# Patient Record
Sex: Male | Born: 1975
Health system: Southern US, Community
[De-identification: ages and names within clinical notes are randomized; demographics above are authoritative.]

## PROBLEM LIST (undated history)

## (undated) DIAGNOSIS — M1990 Unspecified osteoarthritis, unspecified site: Secondary | ICD-10-CM

## (undated) DIAGNOSIS — J45909 Unspecified asthma, uncomplicated: Secondary | ICD-10-CM

## (undated) DIAGNOSIS — I10 Essential (primary) hypertension: Secondary | ICD-10-CM

---

## 2006-03-21 ENCOUNTER — Other Ambulatory Visit: Payer: Self-pay

## 2006-03-21 ENCOUNTER — Emergency Department: Payer: Self-pay | Admitting: Unknown Physician Specialty

## 2014-07-07 ENCOUNTER — Other Ambulatory Visit: Payer: Self-pay

## 2014-07-07 ENCOUNTER — Emergency Department
Admission: EM | Admit: 2014-07-07 | Discharge: 2014-07-07 | Discharge status: Against Medical Advice | Payer: Federal, State, Local not specified - PPO | Attending: Emergency Medicine | Admitting: Emergency Medicine

## 2014-07-07 ENCOUNTER — Encounter: Payer: Self-pay | Admitting: Emergency Medicine

## 2014-07-07 DIAGNOSIS — R531 Weakness: Secondary | ICD-10-CM | POA: Diagnosis present

## 2014-07-07 DIAGNOSIS — R0602 Shortness of breath: Secondary | ICD-10-CM | POA: Diagnosis not present

## 2014-07-07 DIAGNOSIS — R42 Dizziness and giddiness: Secondary | ICD-10-CM | POA: Insufficient documentation

## 2014-07-07 DIAGNOSIS — R55 Syncope and collapse: Secondary | ICD-10-CM | POA: Diagnosis not present

## 2014-07-07 DIAGNOSIS — Z720 Tobacco use: Secondary | ICD-10-CM | POA: Insufficient documentation

## 2014-07-07 LAB — CBC
HCT: 44.5 % (ref 40.0–52.0)
Hemoglobin: 15.5 g/dL (ref 13.0–18.0)
MCH: 30.8 pg (ref 26.0–34.0)
MCHC: 34.7 g/dL (ref 32.0–36.0)
MCV: 88.7 fL (ref 80.0–100.0)
Platelets: 223 10*3/uL (ref 150–440)
RBC: 5.02 MIL/uL (ref 4.40–5.90)
RDW: 12.7 % (ref 11.5–14.5)
WBC: 6.8 10*3/uL (ref 3.8–10.6)

## 2014-07-07 LAB — URINALYSIS COMPLETE WITH MICROSCOPIC (ARMC ONLY)
Bilirubin Urine: NEGATIVE
GLUCOSE, UA: NEGATIVE mg/dL
HGB URINE DIPSTICK: NEGATIVE
Ketones, ur: NEGATIVE mg/dL
LEUKOCYTES UA: NEGATIVE
Nitrite: NEGATIVE
PH: 7 (ref 5.0–8.0)
Protein, ur: NEGATIVE mg/dL
SPECIFIC GRAVITY, URINE: 1.002 — AB (ref 1.005–1.030)
Squamous Epithelial / LPF: NONE SEEN
WBC, UA: NONE SEEN WBC/hpf (ref 0–5)

## 2014-07-07 LAB — BASIC METABOLIC PANEL
Anion gap: 11 (ref 5–15)
BUN: 10 mg/dL (ref 6–20)
CALCIUM: 9.7 mg/dL (ref 8.9–10.3)
CO2: 22 mmol/L (ref 22–32)
Chloride: 105 mmol/L (ref 101–111)
Creatinine, Ser: 0.76 mg/dL (ref 0.61–1.24)
GFR calc non Af Amer: >60 mL/min (ref >60)
Glucose, Bld: 97 mg/dL (ref 65–99)
Potassium: 3.4 mmol/L — ABNORMAL LOW (ref 3.5–5.1)
Sodium: 138 mmol/L (ref 135–145)
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
## 2014-07-07 NOTE — ED Notes (Signed)
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

## 2015-05-08 ENCOUNTER — Emergency Department

## 2015-05-08 ENCOUNTER — Encounter: Payer: Self-pay | Admitting: Emergency Medicine

## 2015-05-08 ENCOUNTER — Emergency Department
Admission: EM | Admit: 2015-05-08 | Discharge: 2015-05-08 | Disposition: A | Discharge status: Home / Self Care | Attending: Emergency Medicine | Admitting: Emergency Medicine

## 2015-05-08 DIAGNOSIS — S29001A Unspecified injury of muscle and tendon of front wall of thorax, initial encounter: Secondary | ICD-10-CM | POA: Diagnosis present

## 2015-05-08 DIAGNOSIS — S6991XA Unspecified injury of right wrist, hand and finger(s), initial encounter: Secondary | ICD-10-CM | POA: Insufficient documentation

## 2015-05-08 DIAGNOSIS — R55 Syncope and collapse: Secondary | ICD-10-CM | POA: Diagnosis not present

## 2015-05-08 DIAGNOSIS — Y998 Other external cause status: Secondary | ICD-10-CM | POA: Diagnosis not present

## 2015-05-08 DIAGNOSIS — Y9389 Activity, other specified: Secondary | ICD-10-CM | POA: Diagnosis not present

## 2015-05-08 DIAGNOSIS — Y92410 Unspecified street and highway as the place of occurrence of the external cause: Secondary | ICD-10-CM | POA: Diagnosis not present

## 2015-05-08 DIAGNOSIS — S40211A Abrasion of right shoulder, initial encounter: Secondary | ICD-10-CM | POA: Diagnosis not present

## 2015-05-08 DIAGNOSIS — T148 Other injury of unspecified body region: Secondary | ICD-10-CM | POA: Diagnosis not present

## 2015-05-08 DIAGNOSIS — S2232XA Fracture of one rib, left side, initial encounter for closed fracture: Secondary | ICD-10-CM | POA: Insufficient documentation

## 2015-05-08 DIAGNOSIS — F17200 Nicotine dependence, unspecified, uncomplicated: Secondary | ICD-10-CM | POA: Insufficient documentation

## 2015-05-08 MED ORDER — OXYCODONE-ACETAMINOPHEN 5-325 MG PO TABS: 1.0000 | ORAL_TABLET | Freq: Four times a day (QID) | ORAL | Status: AC | PRN
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
## 2015-05-08 NOTE — ED Notes (Signed)
Reviewed d/c instructions, follow-up care, prescriptions, and use of incentive spirometry with pt. Pt verbalized understanding.
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
## 2020-08-31 ENCOUNTER — Ambulatory Visit: Payer: Self-pay | Admitting: *Deleted
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
## 2020-08-31 NOTE — Telephone Encounter (Signed)
Pt's wife called to report that the pt has shoulder pain, she says this has been going on for months. His right side only, no other symptoms reported   Best contact: [phone]   Pt is scheduled with [facility] on July 26 with Dr. Charlotta Newton   Called patient to review symptoms . Patient reports right shoulder pain front side of shoulder since last August. Cant raise arm above 90 degrees with out severe pain. Denies any other symptoms. New patient visit scheduled for 09/29/20. Care advise given. Patient verbalized understanding of care advise and to call back if needed .

## 2020-09-25 ENCOUNTER — Ambulatory Visit: Payer: Self-pay | Admitting: Family Medicine

## 2020-09-29 ENCOUNTER — Ambulatory Visit
Admission: RE | Admit: 2020-09-29 | Discharge: 2020-09-29 | Disposition: A | Discharge status: Home / Self Care | Payer: Federal, State, Local not specified - PPO | Attending: Internal Medicine | Admitting: Internal Medicine

## 2020-09-29 ENCOUNTER — Other Ambulatory Visit: Payer: Self-pay | Admitting: Internal Medicine

## 2020-09-29 ENCOUNTER — Ambulatory Visit
Admission: RE | Admit: 2020-09-29 | Discharge: 2020-09-29 | Disposition: A | Discharge status: Home / Self Care | Payer: Federal, State, Local not specified - PPO | Source: Ambulatory Visit | Attending: Internal Medicine | Admitting: Internal Medicine

## 2020-09-29 ENCOUNTER — Other Ambulatory Visit: Payer: Self-pay

## 2020-09-29 ENCOUNTER — Encounter: Payer: Self-pay | Admitting: Internal Medicine

## 2020-09-29 ENCOUNTER — Ambulatory Visit: Payer: Federal, State, Local not specified - PPO | Admitting: Internal Medicine

## 2020-09-29 VITALS — BP 150/99 | HR 78 | Temp 98.6°F | Ht 69.02 in | Wt 212.6 lb

## 2020-09-29 DIAGNOSIS — M25511 Pain in right shoulder: Secondary | ICD-10-CM | POA: Insufficient documentation

## 2020-09-29 DIAGNOSIS — M549 Dorsalgia, unspecified: Secondary | ICD-10-CM | POA: Diagnosis not present

## 2020-09-29 DIAGNOSIS — Z1211 Encounter for screening for malignant neoplasm of colon: Secondary | ICD-10-CM | POA: Insufficient documentation

## 2020-09-29 DIAGNOSIS — M545 Low back pain, unspecified: Secondary | ICD-10-CM | POA: Diagnosis not present

## 2020-09-29 DIAGNOSIS — M19019 Primary osteoarthritis, unspecified shoulder: Secondary | ICD-10-CM | POA: Diagnosis not present

## 2020-09-29 LAB — URINALYSIS, ROUTINE W REFLEX MICROSCOPIC
Bilirubin, UA: NEGATIVE
Glucose, UA: NEGATIVE
Ketones, UA: NEGATIVE
Leukocytes,UA: NEGATIVE
Nitrite, UA: NEGATIVE
Protein,UA: NEGATIVE
Specific Gravity, UA: 1.02 (ref 1.005–1.030)
Urobilinogen, Ur: 0.2 mg/dL (ref 0.2–1.0)
pH, UA: 6.5 (ref 5.0–7.5)

## 2020-09-29 LAB — MICROSCOPIC EXAMINATION
Bacteria, UA: NONE SEEN
Epithelial Cells (non renal): NONE SEEN /hpf (ref 0–10)
WBC, UA: NONE SEEN /hpf (ref 0–5)

## 2020-09-29 MED ORDER — OMEPRAZOLE 20 MG PO CPDR: 20.0000 mg | DELAYED_RELEASE_CAPSULE | Freq: Every day | ORAL | 6 refills | Status: DC

## 2020-09-29 NOTE — Patient Instructions (Signed)
Shoulder Range of Motion Exercises ?Shoulder range of motion (ROM) exercises are done to keep the shoulder moving freely or to increase movement. They are often recommended for people who have shoulder pain or stiffness or who are recovering from a shoulder surgery. ?Phase 1 exercises ?When you are able, do this exercise 1-2 times per day for 30-60 seconds in each direction, or as directed by your health care provider. ?Pendulum exercise ?To do this exercise while sitting: ?Sit in a chair or at the edge of your bed with your feet flat on the floor. ?Let your affected arm hang down in front of you over the edge of the bed or chair. ?Relax your shoulder, arm, and hand. ?Rock your body so your arm gently swings in small circles. You can also use your unaffected arm to start the motion. ?Repeat changing the direction of the circles, swinging your arm left and right, and swinging your arm forward and back. ?To do this exercise while standing: ?Stand next to a sturdy chair or table, and hold on to it with your hand on your unaffected side. ?Bend forward at the waist. ?Bend your knees slightly. ?Relax your shoulder, arm, and hand. ?While keeping your shoulder relaxed, use body motion to swing your arm in small circles. ?Repeat changing the direction of the circles, swinging your arm left and right, and swinging your arm forward and back. ?Between exercises, stand up tall and take a short break to relax your lower back. ? ?Phase 2 exercises ?Do these exercises 1-2 times per day or as told by your health care provider. Hold each stretch for 30 seconds, and repeat 3 times. Do the exercises with one or both arms as instructed by your health care provider. ?For these exercises, sit at a table with your hand and arm supported by the table. A chair that slides easily or has wheels can be helpful. ?External rotation ?Turn your chair so that your affected side is nearest to the table. ?Place your forearm on the table to your side.  Bend your elbow about 90? at the elbow (right angle) and place your hand palm facing down on the table. Your elbow should be about 6 inches away from your side. ?Keeping your arm on the table, lean your body forward. ?Abduction ?Turn your chair so that your affected side is nearest to the table. ?Place your forearm and hand on the table so that your thumb points toward the ceiling and your arm is straight out to your side. ?Slide your hand out to the side and away from you, using your unaffected arm to do the work. ?To increase the stretch, you can slide your chair away from the table. ?Flexion: forward stretch ?Sit facing the table. Place your hand and elbow on the table in front of you. ?Slide your hand forward and away from you, using your unaffected arm to do the work. ?To increase the stretch, you can slide your chair backward. ?Phase 3 exercises ?Do these exercises 1-2 times per day or as told by your health care provider. Hold each stretch for 30 seconds, and repeat 3 times. Do the exercises with one or both arms as instructed by your health care provider. ?Cross-body stretch: posterior capsule stretch ?Lift your arm straight out in front of you. ?Bend your arm 90? at the elbow (right angle) so your forearm moves across your body. ?Use your other arm to gently pull the elbow across your body, toward your other shoulder. ?Wall climbs ?Stand with   your affected arm extended out to the side with your hand resting on a door frame. ?Slide your hand slowly up the door frame. ?To increase the stretch, step through the door frame. Keep your body upright and do not lean. ?Wand exercises ?You will need a cane, a piece of PVC pipe, or a sturdy wooden dowel for wand exercises. ?Flexion ?To do this exercise while standing: ?Hold the wand with both of your hands, palms down. ?Using the other arm to help, lift your arms up and over your head, if able. ?Push upward with your other arm to gently increase the stretch. ?To do  this exercise while lying down: ?Lie on your back with your elbows resting on the floor and the wand in both your hands. Your hands will be palm down, or pointing toward your feet. ?Lift your hands toward the ceiling, using your unaffected arm to help if needed. ?Bring your arms overhead as able, using your unaffected arm to help if needed. ?Internal rotation ?Stand while holding the wand behind you with both hands. Your unaffected arm should be extended above your head with the arm of the affected side extended behind you at the level of your waist. The wand should be pointing straight up and down as you hold it. ?Slowly pull the wand up behind your back by straightening the elbow of your unaffected arm and bending the elbow of your affected arm. ?External rotation ?Lie on your back with your affected upper arm supported on a small pillow or rolled towel. When you first do this exercise, keep your upper arm close to your body. Over time, bring your arm up to a 90? angle out to the side. ?Hold the wand across your stomach and with both hands palm up. Your elbow on your affected side should be bent at a 90? angle. ?Use your unaffected side to help push your forearm away from you and toward the floor. Keep your elbow on your affected side bent at a 90? angle. ?Contact a health care provider if you have: ?New or increasing pain. ?New numbness, tingling, weakness, or discoloration in your arm or hand. ?This information is not intended to replace advice given to you by your health care provider. Make sure you discuss any questions you have with your health care provider. ?Document Revised: 04/05/2017 Document Reviewed: 04/05/2017 ?Elsevier Patient Education ? 2022 [employer]. ? ?

## 2020-09-29 NOTE — Progress Notes (Signed)
BP (!) 150/99   Pulse 78   Temp 98.6 F (37 C) (Oral)   Ht 5' 9.02" (1.753 m)   Wt 212 lb 9.6 oz (96.4 kg)   SpO2 97%   BMI 31.38 kg/m    Subjective:    Patient ID: Gary Gray, male    DOB: [birth year], 45 y.o.   MRN: [id number]  Chief Complaint  Patient presents with  . New Patient (Initial Visit)    To establish care, patient states that he is having Right shoulder pain and low back pain  . Gastroesophageal Reflux    HPI: Gary Gray is a 45 y.o. male  Pt is here to establish care, didn't have a pcp before.  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 He reports no belching, no chest pain, no choking, no coughing, no dysphagia, no early satiety, no globus sensation, no hoarse voice, no nausea, no sore throat, no stridor, no tooth decay, no water brash or no wheezing. takes otc meds zegerid x 1 yrs now.  Shoulder Pain  This is a recurrent problem. The current episode started more than 1 year ago. The problem occurs constantly. The quality of the pain is described as sharp. The pain is at a severity of 7/10. Associated symptoms include a limited range of motion and stiffness. Pertinent negatives include no fever, inability to bear weight, itching, joint locking, joint swelling, numbness or tingling. Associated symptoms comments: Has pain over 90 degrees. Cannot throw a football.. The treatment provided moderate relief. His past medical history is significant for osteoarthritis.   Chief Complaint  Patient presents with  . New Patient (Initial Visit)    To establish care, patient states that he is having Right shoulder pain and low back pain  . Gastroesophageal Reflux    Relevant past medical, surgical, family and social history reviewed and updated as indicated. Interim medical history since our last visit reviewed. Allergies and medications reviewed  and updated.  Review of Systems  Constitutional:  Negative for fever.  HENT:  Negative for hoarse voice and sore throat.   Respiratory:  Negative for cough, choking and wheezing.   Cardiovascular:  Negative for chest pain.  Gastrointestinal:  Positive for abdominal pain and heartburn. Negative for dysphagia and nausea.  Musculoskeletal:  Positive for stiffness.  Skin:  Negative for itching.  Neurological:  Negative for tingling and numbness.   Per HPI unless specifically indicated above     Objective:    BP (!) 150/99   Pulse 78   Temp 98.6 F (37 C) (Oral)   Ht 5' 9.02" (1.753 m)   Wt 212 lb 9.6 oz (96.4 kg)   SpO2 97%   BMI 31.38 kg/m   Wt Readings from Last 3 Encounters:  09/29/20 212 lb 9.6 oz (96.4 kg)  05/08/15 195 lb (88.5 kg)  07/07/14 200 lb (90.7 kg)    Physical Exam Vitals and nursing note reviewed.  Constitutional:      General: He is not in acute distress.    Appearance: Normal appearance. He is not ill-appearing or diaphoretic.  HENT:     Head: Normocephalic and atraumatic.     Right Ear: Tympanic membrane and external ear normal. There is no impacted cerumen.     Left Ear: External ear normal.     Nose: No congestion or rhinorrhea.     Mouth/Throat:  Pharynx: No oropharyngeal exudate or posterior oropharyngeal erythema.  Eyes:     Conjunctiva/sclera: Conjunctivae normal.     Pupils: Pupils are equal, round, and reactive to light.  Cardiovascular:     Rate and Rhythm: Normal rate and regular rhythm.     Heart sounds: No murmur heard.   No friction rub. No gallop.  Pulmonary:     Effort: No respiratory distress.     Breath sounds: No stridor. No wheezing or rhonchi.  Chest:     Chest wall: No tenderness.  Abdominal:     General: Abdomen is flat. Bowel sounds are normal.     Palpations: Abdomen is soft. There is no mass.     Tenderness: There is no abdominal tenderness.  Musculoskeletal:        General: Tenderness present.     Cervical back:  Normal range of motion and neck supple. No rigidity or tenderness.     Left lower leg: No edema.     Comments: Decreased ROM of the right shoulder < 90 degrees  Neurological:     Mental Status: He is alert.   Results for orders placed or performed during the hospital encounter of 07/07/14  CBC  Result Value Ref Range   WBC 6.8 3.8 - 10.6 K/uL   RBC 5.02 4.40 - 5.90 MIL/uL   Hemoglobin 15.5 13.0 - 18.0 g/dL   HCT 76.7 20.9 - 47.0 %   MCV 88.7 80.0 - 100.0 fL   MCH 30.8 26.0 - 34.0 pg   MCHC 34.7 32.0 - 36.0 g/dL   RDW 96.2 83.6 - 62.9 %   Platelets 223 150 - 440 K/uL  Basic metabolic panel  Result Value Ref Range   Sodium 138 135 - 145 mmol/L   Potassium 3.4 (L) 3.5 - 5.1 mmol/L   Chloride 105 101 - 111 mmol/L   CO2 22 22 - 32 mmol/L   Glucose, Bld 97 65 - 99 mg/dL   BUN 10 6 - 20 mg/dL   Creatinine, Ser 4.76 0.61 - 1.24 mg/dL   Calcium 9.7 8.9 - 54.6 mg/dL   GFR calc non Af Amer >60 >60 mL/min   GFR calc Af Amer >60 >60 mL/min   Anion gap 11 5 - 15  Urinalysis complete, with microscopic [facility])  Result Value Ref Range   Color, Urine COLORLESS (A) YELLOW   APPearance CLEAR (A) CLEAR   Glucose, UA NEGATIVE NEGATIVE mg/dL   Bilirubin Urine NEGATIVE NEGATIVE   Ketones, ur NEGATIVE NEGATIVE mg/dL   Specific Gravity, Urine 1.002 (L) 1.005 - 1.030   Hgb urine dipstick NEGATIVE NEGATIVE   pH 7.0 5.0 - 8.0   Protein, ur NEGATIVE NEGATIVE mg/dL   Nitrite NEGATIVE NEGATIVE   Leukocytes, UA NEGATIVE NEGATIVE   RBC / HPF 0-5 0 - 5 RBC/hpf   WBC, UA NONE SEEN 0 - 5 WBC/hpf   Bacteria, UA RARE (A) NONE SEEN   Squamous Epithelial / LPF NONE SEEN NONE SEEN        Current Outpatient Medications:  .  Omeprazole-Sodium Bicarbonate (ZEGERID OTC PO), Take by mouth., Disp: , Rfl:     Assessment & Plan:  Shoulder pain  Check xrays of such  ? Sec to tenditintis.   ? sec RA vs Gout CHeck RF/ ANA / uric acid. check xrays of swollen joints. consider referral to ortho  pt takes pain  meds for such.   2. GERD  Start pt on omeprazole patient advised to avoid laying  down soon after his meals. He took a 2 hours between dinner and bedtime. Avoid spicy food and triggers that he knows food wise that worsen his acid reflux. Patient verbalized understanding of the above. Lifestyle modifications as above discussed with patient.  3. Lower back pain   Check xrays of such.   Problem List Items Addressed This Visit   None    Orders Placed This Encounter  Procedures  . DG Shoulder Right  . DG Lumbar Spine Complete  . Rheumatoid factor  . ANA  . Uric acid  . TSH  . PSA  . CBC with Differential/Platelet  . Comprehensive metabolic panel  . Urinalysis, Routine w reflex microscopic  . Ambulatory referral to Gastroenterology     Meds ordered this encounter  Medications  . omeprazole (PRILOSEC) 20 MG capsule    Sig: Take 1 capsule (20 mg total) by mouth daily.    Dispense:  30 capsule    Refill:  6     Follow up plan: No follow-ups on file.  Health Maintenance : PSA :  ordered Cscope : ordered    Obtained results ofxrays will refer to ortho for below:  Shoulder Xray :  1. Mild-to-moderate degenerative change the right glenohumeral joint. 2. Punctate (3 mm) ossicle inferior to the glenoid may represent a intra-articular loose body though a discrete donor site is not identified. 3. Minimal enthesopathic change involving the rotator cuff insertion site, nonspecific though could be seen in the setting of rotator cuff pathology.   L Spine xray  1. Age-indeterminate mild (approximately 25%) compression deformities involving the T12 and L1 vertebral bodies. Correlation for point tenderness at these locations is advised. 2. Mild multilevel lumbar spine DDD, worse at T12-L1 and L1-L2.

## 2020-09-29 NOTE — Progress Notes (Signed)
Needs referral to ortho asap please. T12 and L1 vertebral bodies with age-indeterminate compression deformitiesMild multilevel lumbar spine DDD, worse at T12-L1 and L1-L2 with disc space height loss, endplate irregularity and sclerosis.

## 2020-09-30 ENCOUNTER — Encounter: Payer: Self-pay | Admitting: *Deleted

## 2020-09-30 ENCOUNTER — Ambulatory Visit (INDEPENDENT_AMBULATORY_CARE_PROVIDER_SITE_OTHER): Payer: Federal, State, Local not specified - PPO | Admitting: Family Medicine

## 2020-09-30 DIAGNOSIS — G8929 Other chronic pain: Secondary | ICD-10-CM | POA: Diagnosis not present

## 2020-09-30 DIAGNOSIS — M25511 Pain in right shoulder: Secondary | ICD-10-CM | POA: Diagnosis not present

## 2020-09-30 DIAGNOSIS — M545 Low back pain, unspecified: Secondary | ICD-10-CM | POA: Diagnosis not present

## 2020-09-30 LAB — CBC WITH DIFFERENTIAL/PLATELET
Basophils Absolute: 0.1 10*3/uL (ref 0.0–0.2)
Basos: 1 %
EOS (ABSOLUTE): 0.2 10*3/uL (ref 0.0–0.4)
Eos: 2 %
Hematocrit: 47.3 % (ref 37.5–51.0)
Hemoglobin: 16.2 g/dL (ref 13.0–17.7)
Immature Grans (Abs): 0 10*3/uL (ref 0.0–0.1)
Immature Granulocytes: 0 %
Lymphocytes Absolute: 2.1 10*3/uL (ref 0.7–3.1)
Lymphs: 32 %
MCH: 31.6 pg (ref 26.6–33.0)
MCHC: 34.2 g/dL (ref 31.5–35.7)
MCV: 92 fL (ref 79–97)
Monocytes Absolute: 0.5 10*3/uL (ref 0.1–0.9)
Monocytes: 8 %
Neutrophils Absolute: 3.8 10*3/uL (ref 1.4–7.0)
Neutrophils: 57 %
Platelets: 233 10*3/uL (ref 150–450)
RBC: 5.12 x10E6/uL (ref 4.14–5.80)
RDW: 12.7 % (ref 11.6–15.4)
WBC: 6.7 10*3/uL (ref 3.4–10.8)

## 2020-09-30 LAB — COMPREHENSIVE METABOLIC PANEL
ALT: 63 IU/L — ABNORMAL HIGH (ref 0–44)
AST: 40 IU/L (ref 0–40)
Albumin/Globulin Ratio: 2.3 — ABNORMAL HIGH (ref 1.2–2.2)
Albumin: 4.8 g/dL (ref 4.0–5.0)
Alkaline Phosphatase: 65 IU/L (ref 44–121)
BUN/Creatinine Ratio: 11 (ref 9–20)
BUN: 10 mg/dL (ref 6–24)
Bilirubin Total: 0.4 mg/dL (ref 0.0–1.2)
CO2: 27 mmol/L (ref 20–29)
Calcium: 10.1 mg/dL (ref 8.7–10.2)
Chloride: 100 mmol/L (ref 96–106)
Creatinine, Ser: 0.89 mg/dL (ref 0.76–1.27)
Globulin, Total: 2.1 g/dL (ref 1.5–4.5)
Glucose: 84 mg/dL (ref 65–99)
Potassium: 4 mmol/L (ref 3.5–5.2)
Sodium: 142 mmol/L (ref 134–144)
Total Protein: 6.9 g/dL (ref 6.0–8.5)
eGFR: 108 mL/min/{1.73_m2} (ref >59)

## 2020-09-30 LAB — TSH: TSH: 0.956 u[IU]/mL (ref 0.450–4.500)

## 2020-09-30 LAB — URIC ACID: Uric Acid: 5.4 mg/dL (ref 3.8–8.4)

## 2020-09-30 LAB — RHEUMATOID FACTOR: Rhuematoid fact SerPl-aCnc: 11 IU/mL (ref <14.0)

## 2020-09-30 LAB — PSA: Prostate Specific Ag, Serum: 0.5 ng/mL (ref 0.0–4.0)

## 2020-09-30 LAB — ANA: Anti Nuclear Antibody (ANA): NEGATIVE

## 2020-09-30 MED ORDER — MELOXICAM 15 MG PO TABS: 7.5000 mg | ORAL_TABLET | Freq: Every day | ORAL | 6 refills | Status: DC | PRN

## 2020-09-30 MED ORDER — BACLOFEN 10 MG PO TABS: 5.0000 mg | ORAL_TABLET | Freq: Every evening | ORAL | 3 refills | Status: DC | PRN

## 2020-09-30 NOTE — Progress Notes (Signed)
Please let pt know this was normal.

## 2020-09-30 NOTE — Progress Notes (Signed)
Office Visit Note   Patient: Gary Gray           Date of Birth: June 05, 1975           MRN: [id number] Visit Date: 09/30/2020 Requested by: Loura Pardon, MD [address] [place],  [place] [address] PCP: Patient, No Pcp Per (Inactive)  Subjective: Chief Complaint  Patient presents with   Lower Back - Pain    Chronic pain x years - h/o falling through a roof and landing on a toilet. The pain is always there - some days worse than others. The pain is mostly along the spine, lower middle spine and down. Occasionally, pain will move to the right or the left side.     HPI: He is here with right shoulder and low back pain.  When he was a teenager he was working on a roof and fell through it landing on the toilet.  He injured his back but did not seek treatment.  He continued to work but he has had pain in his back ever since then.  Denies radicular symptoms.  Pain is primarily in the lower lumbar area.  His right shoulder has bothered him for the past year.  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  He has done a lot of throwing over the years.  He thinks he might have just overdone it with his shoulder.  He has pain when reaching overhead.  He has decreased range of motion.  He does not take medications on a regular basis.  He is otherwise been in good medical health.  No family history of rheumatologic disease to his knowledge.  He works Data processing manager.                ROS:   All other systems were reviewed and are negative.  Objective: Vital Signs: There were no vitals taken for this visit.  Physical Exam:  General:  Alert and oriented, in no acute distress. Pulm:  Breathing unlabored. Psy:  Normal mood, congruent affect.  Right shoulder: Full active range of motion with pain at the extremes.  He has pain with empty can test but still has good rotator cuff strength.  He has pain with external rotation.  He has no tenderness to palpation of the shoulder, I cannot  reproduce his pain by palpation.  His pain is reproduced with passive impingement test especially abduction and external rotation. Low back: He has a tender trigger point to the right of the L3-4 area and this seems to reproduce a lot of his pain.  No tenderness over the bony spinous processes.  No tenderness over the SI joint or the sciatic notch.  Straight leg raise is negative, lower extremity strength and reflexes are normal.    Imaging: DG Lumbar Spine Complete  Result Date: 09/29/2020 CLINICAL DATA:  Low back pain for many years remote history of fall. No recent injury. EXAM: LUMBAR SPINE - COMPLETE 4+ VIEW COMPARISON:  None. FINDINGS: There is a suspected solitary right-sided rib at L1. For the purposes of this dictation, this level will be labeled L1. Normal alignment of the lumbar spine. No anterolisthesis or retrolisthesis. Mild (approximately 25%) age-indeterminate compression deformities involving the T12 and L1 vertebral bodies. Mild multilevel lumbar spine DDD, worse at T12-L1 and L1-L2 with disc space height loss, endplate irregularity and sclerosis. Limited visualization of the bilateral SI joints is normal. Regional bowel gas pattern and soft tissues are normal. IMPRESSION: 1.  Age-indeterminate mild (approximately 25%) compression deformities involving the T12 and L1 vertebral bodies. Correlation for point tenderness at these locations is advised. 2. Mild multilevel lumbar spine DDD, worse at T12-L1 and L1-L2. Electronically Signed   By: Simonne Come M.D.   On: 09/29/2020 15:04   DG Shoulder Right  Result Date: 09/29/2020 CLINICAL DATA:  Right shoulder pain for the past year. No known injury. EXAM: RIGHT SHOULDER - 2+ VIEW COMPARISON:  None. FINDINGS: No fracture or dislocation. Mild to moderate degenerative change of the right glenohumeral joint with joint space loss, subchondral sclerosis and osteophytosis. There is a punctate (3 mm) ossicle adjacent to the inferior aspect of the  glenoid potentially representative of an intra-articular loose body though a discrete donor site is not identified. Acromioclavicular joint spaces appear preserved. Minimal enthesopathic change involving the rotator cuff insertion site. No evidence of calcific tendinitis. Limited visualization of the adjacent thorax is normal. Regional soft tissues appear normal. IMPRESSION: 1. Mild-to-moderate degenerative change the right glenohumeral joint. 2. Punctate (3 mm) ossicle inferior to the glenoid may represent a intra-articular loose body though a discrete donor site is not identified. 3. Minimal enthesopathic change involving the rotator cuff insertion site, nonspecific though could be seen in the setting of rotator cuff pathology. Electronically Signed   By: Simonne Come M.D.   On: 09/29/2020 15:06    Assessment & Plan: Chronic right shoulder pain with glenohumeral degenerative change on x-ray.  Cannot rule out partial rotator cuff tear. -We will try physical therapy, meloxicam and baclofen as needed.  Subacromial injection and/or MRI scan if he fails to improve.  2.  Chronic back pain with L5-S1 degenerative disc disease, myofascial pain. -Discal therapy.  MRI scan if fails to improve.     Procedures: No procedures performed        PMFS History: Patient Active Problem List   Diagnosis Date Noted   Arthritis pain, shoulder 09/29/2020   Right shoulder pain 09/29/2020   Screen for colon cancer 09/29/2020   Back pain 09/29/2020   No past medical history on file.  Family History  Problem Relation Age of Onset   Diabetes Mother    Heart disease Father    Atrial fibrillation Father    Diabetes Maternal Grandfather    Breast cancer Paternal Grandmother     No past surgical history on file. Social History   Occupational History   Not on file  Tobacco Use   Smoking status: Every Day    Packs/day: 1.50    Types: Cigarettes    Passive exposure: Current   Smokeless tobacco: Never   Vaping Use   Vaping Use: Never used  Substance and Sexual Activity   Alcohol use: Yes    Alcohol/week: 3.0 standard drinks    Types: 3 Cans of beer per week    Comment: per night   Drug use: No   Sexual activity: Yes

## 2020-10-05 ENCOUNTER — Encounter: Payer: Self-pay | Admitting: Family Medicine

## 2020-10-05 ENCOUNTER — Telehealth: Payer: Self-pay | Admitting: Family Medicine

## 2020-10-05 DIAGNOSIS — M545 Low back pain, unspecified: Secondary | ICD-10-CM

## 2020-10-05 DIAGNOSIS — G8929 Other chronic pain: Secondary | ICD-10-CM

## 2020-10-05 DIAGNOSIS — M25511 Pain in right shoulder: Secondary | ICD-10-CM

## 2020-10-05 NOTE — Telephone Encounter (Signed)
He was referred to [facility] PT. Is there somewhere else that you would recommend, instead, for him?

## 2020-10-05 NOTE — Telephone Encounter (Signed)
Pt called requesting a call back. Pt states Dr. Prince Rome sent a referral on behalf of him and they cant see him until next month and want to know if referral can be sent to a different facility. Please call pt at [phone].

## 2020-10-06 ENCOUNTER — Telehealth: Payer: Self-pay | Admitting: Family Medicine

## 2020-10-06 NOTE — Telephone Encounter (Signed)
Pt called asking to have his PT referral resent. He states when he called the office to set an appt they told him they hadn't received anything. Pt got a fax number that should work and would like a CB when this has been refaxed.   Fax# [phone]

## 2020-10-06 NOTE — Telephone Encounter (Signed)
Just letting you know this was done.

## 2020-10-06 NOTE — Telephone Encounter (Signed)
I faxed the referral + demographics. Patient advised.

## 2020-10-12 ENCOUNTER — Telehealth: Payer: Self-pay | Admitting: *Deleted

## 2020-10-12 NOTE — Telephone Encounter (Signed)
Pt brought in FMLA form to be completed by Dr Charlotta Newton and wants it to be sent off in prepost envelope please and thx Placed in BIN for review

## 2020-10-13 NOTE — Telephone Encounter (Signed)
Spoke with patient and informed him that the more appropriate person to fill out his FMLA would be his orthopedic doctor, Dr. Prince Rome. Patient verbalized understanding and will come by and pick up his paperwork and take it to his orthopedic doctor

## 2020-10-19 NOTE — Telephone Encounter (Signed)
Pt called back stating the Dr Prince Rome said that he does not fill out fmla paperwork.  Patient is asking for PCP to fill out paperwork and send in.  He states that paperwork is still at the front desk.  Please call pt at [phone]  Thank you

## 2020-10-20 ENCOUNTER — Ambulatory Visit: Payer: Federal, State, Local not specified - PPO | Admitting: Internal Medicine

## 2020-10-20 NOTE — Telephone Encounter (Signed)
Called and spoke with Ortho, Dr.Hilts does not fill out FMLA but someone in the office will. Called and left a message for patient letting him know that he can take the paperwork to his office and pay $25.00 and that they will get it filled out for him.

## 2020-10-20 NOTE — Telephone Encounter (Signed)
We will need the orthopedist's note with restrictions and how long he expects him to be out considering he is out for an orthopedic problem.

## 2020-11-04 ENCOUNTER — Ambulatory Visit: Payer: Federal, State, Local not specified - PPO

## 2020-11-16 ENCOUNTER — Ambulatory Visit: Payer: Federal, State, Local not specified - PPO | Admitting: Internal Medicine

## 2020-11-17 ENCOUNTER — Encounter: Payer: Self-pay | Admitting: Internal Medicine

## 2020-11-17 ENCOUNTER — Ambulatory Visit: Payer: Federal, State, Local not specified - PPO | Admitting: Internal Medicine

## 2020-11-17 ENCOUNTER — Other Ambulatory Visit: Payer: Self-pay

## 2020-11-17 VITALS — BP 140/90 | HR 85 | Temp 98.4°F | Ht 68.9 in | Wt 215.6 lb

## 2020-11-17 DIAGNOSIS — Z125 Encounter for screening for malignant neoplasm of prostate: Secondary | ICD-10-CM

## 2020-11-17 DIAGNOSIS — Z1329 Encounter for screening for other suspected endocrine disorder: Secondary | ICD-10-CM

## 2020-11-17 DIAGNOSIS — M25519 Pain in unspecified shoulder: Secondary | ICD-10-CM | POA: Diagnosis not present

## 2020-11-17 DIAGNOSIS — Z131 Encounter for screening for diabetes mellitus: Secondary | ICD-10-CM | POA: Diagnosis not present

## 2020-11-17 DIAGNOSIS — Z136 Encounter for screening for cardiovascular disorders: Secondary | ICD-10-CM

## 2020-11-17 NOTE — Progress Notes (Signed)
BP 140/90   Pulse 85   Temp 98.4 F (36.9 C) (Oral)   Ht 5' 8.9" (1.75 m)   Wt 215 lb 9.6 oz (97.8 kg)   SpO2 98%   BMI 31.93 kg/m    Subjective:    Patient ID: Gary Gray, male    DOB: 03-Apr-1975, 45 y.o.   MRN: [id number]  Chief Complaint  Patient presents with   Back and Shoulder Pain    Patient states that this is getting better, had been going to PT but was cut loose by PT    HPI: Gary Gray is a 45 y.o. male  Dr. Derry Lory @ ortho - pain in back pain and shoulder pain was sent to PT helped per pt. Had compression fractures in T1 - L1 L2 he fell in his teen years from a roof    Chief Complaint  Patient presents with   Back and Shoulder Pain    Patient states that this is getting better, had been going to PT but was cut loose by PT    Relevant past medical, surgical, family and social history reviewed and updated as indicated. Interim medical history since our last visit reviewed. Allergies and medications reviewed and updated.  Review of Systems  Constitutional:  Negative for activity change, appetite change, chills, fatigue and fever.  HENT:  Negative for congestion, ear discharge, ear pain and facial swelling.   Eyes:  Negative for pain, discharge and itching.  Respiratory:  Negative for cough, chest tightness, shortness of breath and wheezing.   Cardiovascular:  Negative for chest pain, palpitations and leg swelling.  Gastrointestinal:  Negative for abdominal distention, abdominal pain, blood in stool, constipation, diarrhea, nausea and vomiting.  Endocrine: Negative for cold intolerance, heat intolerance, polydipsia, polyphagia and polyuria.  Genitourinary:  Negative for difficulty urinating, dysuria, flank pain, frequency, hematuria and urgency.  Musculoskeletal:  Positive for joint swelling. Negative for arthralgias, gait problem and myalgias.  Skin:  Negative for color change, rash and wound.  Neurological:  Negative for dizziness, tremors, speech  difficulty, weakness, light-headedness, numbness and headaches.  Hematological:  Does not bruise/bleed easily.  Psychiatric/Behavioral:  Negative for agitation, confusion, decreased concentration, sleep disturbance and suicidal ideas.    Per HPI unless specifically indicated above     Objective:    BP 140/90   Pulse 85   Temp 98.4 F (36.9 C) (Oral)   Ht 5' 8.9" (1.75 m)   Wt 215 lb 9.6 oz (97.8 kg)   SpO2 98%   BMI 31.93 kg/m   Wt Readings from Last 3 Encounters:  11/17/20 215 lb 9.6 oz (97.8 kg)  09/29/20 212 lb 9.6 oz (96.4 kg)  05/08/15 195 lb (88.5 kg)    Physical Exam Vitals and nursing note reviewed.  Constitutional:      General: He is not in acute distress.    Appearance: Normal appearance. He is not ill-appearing or diaphoretic.  HENT:     Head: Atraumatic.  Cardiovascular:     Rate and Rhythm: Normal rate and regular rhythm.  Pulmonary:     Effort: Pulmonary effort is normal. No respiratory distress.     Breath sounds: Normal breath sounds. No stridor. No wheezing.  Abdominal:     General: Abdomen is flat. There is no distension.  Musculoskeletal:        General: Tenderness present. No swelling, deformity or signs of injury.     Right lower leg: No edema.     Left lower  leg: No edema.  Skin:    General: Skin is warm and dry.  Neurological:     Mental Status: He is alert.     Cranial Nerves: No cranial nerve deficit.  Psychiatric:        Mood and Affect: Mood normal.        Behavior: Behavior normal.    Results for orders placed or performed in visit on 09/29/20  Microscopic Examination   Urine  Result Value Ref Range   WBC, UA None seen 0 - 5 /hpf   RBC 0-2 0 - 2 /hpf   Epithelial Cells (non renal) None seen 0 - 10 /hpf   Bacteria, UA None seen None seen/Few  Rheumatoid factor  Result Value Ref Range   Rhuematoid fact SerPl-aCnc 11.0 <14.0 IU/mL  ANA  Result Value Ref Range   Anti Nuclear Antibody (ANA) Negative Negative  Uric acid  Result  Value Ref Range   Uric Acid 5.4 3.8 - 8.4 mg/dL  TSH  Result Value Ref Range   TSH 0.956 0.450 - 4.500 uIU/mL  PSA  Result Value Ref Range   Prostate Specific Ag, Serum 0.5 0.0 - 4.0 ng/mL  CBC with Differential/Platelet  Result Value Ref Range   WBC 6.7 3.4 - 10.8 x10E3/uL   RBC 5.12 4.14 - 5.80 x10E6/uL   Hemoglobin 16.2 13.0 - 17.7 g/dL   Hematocrit 47.3 37.5 - 51.0 %   MCV 92 79 - 97 fL   MCH 31.6 26.6 - 33.0 pg   MCHC 34.2 31.5 - 35.7 g/dL   RDW 12.7 11.6 - 15.4 %   Platelets 233 150 - 450 x10E3/uL   Neutrophils 57 Not Estab. %   Lymphs 32 Not Estab. %   Monocytes 8 Not Estab. %   Eos 2 Not Estab. %   Basos 1 Not Estab. %   Neutrophils Absolute 3.8 1.4 - 7.0 x10E3/uL   Lymphocytes Absolute 2.1 0.7 - 3.1 x10E3/uL   Monocytes Absolute 0.5 0.1 - 0.9 x10E3/uL   EOS (ABSOLUTE) 0.2 0.0 - 0.4 x10E3/uL   Basophils Absolute 0.1 0.0 - 0.2 x10E3/uL   Immature Granulocytes 0 Not Estab. %   Immature Grans (Abs) 0.0 0.0 - 0.1 x10E3/uL  Comprehensive metabolic panel  Result Value Ref Range   Glucose 84 65 - 99 mg/dL   BUN 10 6 - 24 mg/dL   Creatinine, Ser 0.89 0.76 - 1.27 mg/dL   eGFR 108 >59 mL/min/1.73   BUN/Creatinine Ratio 11 9 - 20   Sodium 142 134 - 144 mmol/L   Potassium 4.0 3.5 - 5.2 mmol/L   Chloride 100 96 - 106 mmol/L   CO2 27 20 - 29 mmol/L   Calcium 10.1 8.7 - 10.2 mg/dL   Total Protein 6.9 6.0 - 8.5 g/dL   Albumin 4.8 4.0 - 5.0 g/dL   Globulin, Total 2.1 1.5 - 4.5 g/dL   Albumin/Globulin Ratio 2.3 (H) 1.2 - 2.2   Bilirubin Total 0.4 0.0 - 1.2 mg/dL   Alkaline Phosphatase 65 44 - 121 IU/L   AST 40 0 - 40 IU/L   ALT 63 (H) 0 - 44 IU/L  Urinalysis, Routine w reflex microscopic  Result Value Ref Range   Specific Gravity, UA 1.020 1.005 - 1.030   pH, UA 6.5 5.0 - 7.5   Color, UA Yellow Yellow   Appearance Ur Clear Clear   Leukocytes,UA Negative Negative   Protein,UA Negative Negative/Trace   Glucose, UA Negative Negative   Ketones, UA  Negative Negative    RBC, UA Trace (A) Negative   Bilirubin, UA Negative Negative   Urobilinogen, Ur 0.2 0.2 - 1.0 mg/dL   Nitrite, UA Negative Negative   Microscopic Examination See below:         Current Outpatient Medications:    baclofen (LIORESAL) 10 MG tablet, Take 0.5-1 tablets (5-10 mg total) by mouth at bedtime as needed for muscle spasms., Disp: 30 each, Rfl: 3   meloxicam (MOBIC) 15 MG tablet, Take 0.5-1 tablets (7.5-15 mg total) by mouth daily as needed for pain., Disp: 30 tablet, Rfl: 6   omeprazole (PRILOSEC) 20 MG capsule, Take 1 capsule (20 mg total) by mouth daily., Disp: 30 capsule, Rfl: 6    Assessment & Plan:  Shoulder pain :  Check xrays of such ,? Sec to tenditintis.  check xrays of swollen joints. consider referral to ortho  pt takes pain meds for such.  IMPRESSION: 1. Mild-to-moderate degenerative change the right glenohumeral joint. 2. Punctate (3 mm) ossicle inferior to the glenoid may represent a intra-articular loose body though a discrete donor site is not identified. 3. Minimal enthesopathic change involving the rotator cuff insertion site, nonspecific though could be seen in the setting of rotator cuff pathology  Shoulder Pain ; sees physical therapy for such, gets Pt at home and helped.   Chronic right shoulder pain with glenohumeral degenerative change on x-ray.  Cannot rule out partial rotator cuff tear. -We will try physical therapy, meloxicam and baclofen as needed.  Subacromial injection and/or MRI scan if he fails to improve.  2.  Chronic back pain with L5-S1 degenerative disc disease, myofascial pain. -Discal therapy.  MRI scan if fails to improve.   Fu and mx per Dr. Junius Roads   Problem List Items Addressed This Visit   None    Orders Placed This Encounter  Procedures   PSA Total+%Free (Serial)   Comprehensive metabolic panel   CBC with Differential/Platelet   Thyroid Panel With TSH   Bayer DCA Hb A1c Waived   Lipid panel     No orders of the defined  types were placed in this encounter.    Follow up plan: No follow-ups on file.

## 2020-11-19 DIAGNOSIS — M25519 Pain in unspecified shoulder: Secondary | ICD-10-CM | POA: Insufficient documentation

## 2021-05-12 ENCOUNTER — Other Ambulatory Visit: Payer: Federal, State, Local not specified - PPO

## 2021-05-12 ENCOUNTER — Other Ambulatory Visit: Payer: Self-pay

## 2021-05-12 DIAGNOSIS — Z125 Encounter for screening for malignant neoplasm of prostate: Secondary | ICD-10-CM

## 2021-05-12 DIAGNOSIS — Z136 Encounter for screening for cardiovascular disorders: Secondary | ICD-10-CM | POA: Diagnosis not present

## 2021-05-12 DIAGNOSIS — Z1329 Encounter for screening for other suspected endocrine disorder: Secondary | ICD-10-CM | POA: Diagnosis not present

## 2021-05-12 DIAGNOSIS — Z131 Encounter for screening for diabetes mellitus: Secondary | ICD-10-CM

## 2021-05-12 LAB — BAYER DCA HB A1C WAIVED: HB A1C (BAYER DCA - WAIVED): 5.2 % (ref 4.8–5.6)

## 2021-05-13 LAB — COMPREHENSIVE METABOLIC PANEL
ALT: 42 IU/L (ref 0–44)
AST: 38 IU/L (ref 0–40)
Albumin/Globulin Ratio: 2.1 (ref 1.2–2.2)
Albumin: 4.8 g/dL (ref 4.0–5.0)
Alkaline Phosphatase: 85 IU/L (ref 44–121)
BUN/Creatinine Ratio: 11 (ref 9–20)
BUN: 9 mg/dL (ref 6–24)
Bilirubin Total: 0.6 mg/dL (ref 0.0–1.2)
CO2: 21 mmol/L (ref 20–29)
Calcium: 9.5 mg/dL (ref 8.7–10.2)
Chloride: 98 mmol/L (ref 96–106)
Creatinine, Ser: 0.84 mg/dL (ref 0.76–1.27)
Globulin, Total: 2.3 g/dL (ref 1.5–4.5)
Glucose: 82 mg/dL (ref 70–99)
Potassium: 3.9 mmol/L (ref 3.5–5.2)
Sodium: 137 mmol/L (ref 134–144)
Total Protein: 7.1 g/dL (ref 6.0–8.5)
eGFR: 110 mL/min/{1.73_m2} (ref >59)

## 2021-05-13 LAB — PSA TOTAL+% FREE (SERIAL)
PSA, Free Pct: 36 %
PSA, Free: 0.18 ng/mL
Prostate Specific Ag, Serum: 0.5 ng/mL (ref 0.0–4.0)

## 2021-05-13 LAB — CBC WITH DIFFERENTIAL/PLATELET
Basophils Absolute: 0.1 10*3/uL (ref 0.0–0.2)
Basos: 1 %
EOS (ABSOLUTE): 0.1 10*3/uL (ref 0.0–0.4)
Eos: 2 %
Hematocrit: 45.4 % (ref 37.5–51.0)
Hemoglobin: 15.8 g/dL (ref 13.0–17.7)
Immature Grans (Abs): 0 10*3/uL (ref 0.0–0.1)
Immature Granulocytes: 0 %
Lymphocytes Absolute: 1.7 10*3/uL (ref 0.7–3.1)
Lymphs: 28 %
MCH: 31.5 pg (ref 26.6–33.0)
MCHC: 34.8 g/dL (ref 31.5–35.7)
MCV: 91 fL (ref 79–97)
Monocytes Absolute: 0.5 10*3/uL (ref 0.1–0.9)
Monocytes: 8 %
Neutrophils Absolute: 3.7 10*3/uL (ref 1.4–7.0)
Neutrophils: 61 %
Platelets: 192 10*3/uL (ref 150–450)
RBC: 5.01 x10E6/uL (ref 4.14–5.80)
RDW: 12.3 % (ref 11.6–15.4)
WBC: 6.1 10*3/uL (ref 3.4–10.8)

## 2021-05-13 LAB — LIPID PANEL
Chol/HDL Ratio: 4.4 ratio (ref 0.0–5.0)
Cholesterol, Total: 198 mg/dL (ref 100–199)
HDL: 45 mg/dL (ref >39)
LDL Chol Calc (NIH): 133 mg/dL — ABNORMAL HIGH (ref 0–99)
Triglycerides: 111 mg/dL (ref 0–149)
VLDL Cholesterol Cal: 20 mg/dL (ref 5–40)

## 2021-05-13 LAB — THYROID PANEL WITH TSH
Free Thyroxine Index: 2 (ref 1.2–4.9)
T3 Uptake Ratio: 25 % (ref 24–39)
T4, Total: 7.9 ug/dL (ref 4.5–12.0)
TSH: 1.18 u[IU]/mL (ref 0.450–4.500)

## 2021-05-19 ENCOUNTER — Encounter: Payer: Self-pay | Admitting: Internal Medicine

## 2021-05-19 ENCOUNTER — Ambulatory Visit: Payer: Federal, State, Local not specified - PPO | Admitting: Internal Medicine

## 2021-05-19 ENCOUNTER — Other Ambulatory Visit: Payer: Self-pay

## 2021-05-19 VITALS — BP 122/82 | HR 82 | Temp 98.8°F | Ht 68.9 in | Wt 214.0 lb

## 2021-05-19 DIAGNOSIS — M549 Dorsalgia, unspecified: Secondary | ICD-10-CM

## 2021-05-19 DIAGNOSIS — G8929 Other chronic pain: Secondary | ICD-10-CM

## 2021-05-19 DIAGNOSIS — M25511 Pain in right shoulder: Secondary | ICD-10-CM

## 2021-05-19 MED ORDER — MELOXICAM 15 MG PO TABS: 7.5000 mg | ORAL_TABLET | Freq: Every day | ORAL | 6 refills | Status: DC | PRN

## 2021-05-19 NOTE — Progress Notes (Signed)
? ?BP 122/82   Pulse 82   Temp 98.8 ?F (37.1 ?C) (Oral)   Ht 5' 8.9" (1.75 m)   Wt 214 lb (97.1 kg)   SpO2 98%   BMI 31.70 kg/m?   ? ?Subjective:  ? ? Patient ID: Gary Gray, male    DOB: [birth year], 46 y.o.   MRN: [id number] ? ?Chief Complaint  ?Patient presents with  ?? Shoulder Pain  ?  Left shoulder has resolved  ?? lab work   ?  Review of lab work from last week  ? ? ?HPI: ?Gary Gray is a 46 y.o. male ? ?Shoulder Pain  ?This is a chronic problem. The current episode started more than 1 year ago. Pertinent negatives include no fever, numbness or tingling.  ?Back Pain ?This is a chronic (back pain sec to compression fractures lower back 2019 L and T spine sees orhto for such) problem. The problem occurs intermittently. The problem has been gradually worsening since onset. The pain is present in the lumbar spine. Pertinent negatives include no abdominal pain, bladder incontinence, bowel incontinence, chest pain, dysuria, fever, headaches, leg pain, numbness, paresis, paresthesias, pelvic pain, perianal numbness, tingling, weakness or weight loss.  ? ?Chief Complaint  ?Patient presents with  ?? Shoulder Pain  ?  Left shoulder has resolved  ?? lab work   ?  Review of lab work from last week  ? ? ?Relevant past medical, surgical, family and social history reviewed and updated as indicated. Interim medical history since our last visit reviewed. ?Allergies and medications reviewed and updated. ? ?Review of Systems  ?Constitutional:  Negative for fever and weight loss.  ?Cardiovascular:  Negative for chest pain.  ?Gastrointestinal:  Negative for abdominal pain and bowel incontinence.  ?Genitourinary:  Negative for bladder incontinence, dysuria and pelvic pain.  ?Musculoskeletal:  Positive for back pain.  ?Neurological:  Negative for tingling, weakness, numbness, headaches and paresthesias.  ? ?Per HPI unless specifically indicated above ? ?   ?Objective:  ?  ?BP 122/82   Pulse 82   Temp 98.8 ?F (37.1 ?C)  (Oral)   Ht 5' 8.9" (1.75 m)   Wt 214 lb (97.1 kg)   SpO2 98%   BMI 31.70 kg/m?   ?Wt Readings from Last 3 Encounters:  ?05/19/21 214 lb (97.1 kg)  ?11/17/20 215 lb 9.6 oz (97.8 kg)  ?09/29/20 212 lb 9.6 oz (96.4 kg)  ?  ?Physical Exam ?Vitals and nursing note reviewed.  ?Constitutional:   ?   General: He is not in acute distress. ?   Appearance: Normal appearance. He is not ill-appearing or diaphoretic.  ?HENT:  ?   Head: Normocephalic and atraumatic.  ?   Right Ear: Tympanic membrane and external ear normal. There is no impacted cerumen.  ?   Left Ear: External ear normal.  ?   Nose: No congestion or rhinorrhea.  ?   Mouth/Throat:  ?   Pharynx: No oropharyngeal exudate or posterior oropharyngeal erythema.  ?Eyes:  ?   Conjunctiva/sclera: Conjunctivae normal.  ?   Pupils: Pupils are equal, round, and reactive to light.  ?Cardiovascular:  ?   Rate and Rhythm: Normal rate and regular rhythm.  ?   Heart sounds: No murmur heard. ?  No friction rub. No gallop.  ?Pulmonary:  ?   Effort: No respiratory distress.  ?   Breath sounds: No stridor. No wheezing or rhonchi.  ?Chest:  ?   Chest wall: No tenderness.  ?Abdominal:  ?  General: Abdomen is flat. Bowel sounds are normal.  ?   Palpations: Abdomen is soft. There is no mass.  ?   Tenderness: There is no abdominal tenderness.  ?Musculoskeletal:  ?   Cervical back: Normal range of motion and neck supple. No rigidity or tenderness.  ?   Left lower leg: No edema.  ?Skin: ?   General: Skin is warm and dry.  ?Neurological:  ?   Mental Status: He is alert.  ? ? ?Results for orders placed or performed in visit on 05/12/21  ?Lipid panel  ?Result Value Ref Range  ? Cholesterol, Total 198 100 - 199 mg/dL  ? Triglycerides 111 0 - 149 mg/dL  ? HDL 45 >39 mg/dL  ? VLDL Cholesterol Cal 20 5 - 40 mg/dL  ? LDL Chol Calc (NIH) 133 (H) 0 - 99 mg/dL  ? Chol/HDL Ratio 4.4 0.0 - 5.0 ratio  ?Bayer DCA Hb A1c Waived  ?Result Value Ref Range  ? HB A1C (BAYER DCA - WAIVED) 5.2 4.8 - 5.6 %   ?Thyroid Panel With TSH  ?Result Value Ref Range  ? TSH 1.180 0.450 - 4.500 uIU/mL  ? T4, Total 7.9 4.5 - 12.0 ug/dL  ? T3 Uptake Ratio 25 24 - 39 %  ? Free Thyroxine Index 2.0 1.2 - 4.9  ?CBC with Differential/Platelet  ?Result Value Ref Range  ? WBC 6.1 3.4 - 10.8 x10E3/uL  ? RBC 5.01 4.14 - 5.80 x10E6/uL  ? Hemoglobin 15.8 13.0 - 17.7 g/dL  ? Hematocrit 45.4 37.5 - 51.0 %  ? MCV 91 79 - 97 fL  ? MCH 31.5 26.6 - 33.0 pg  ? MCHC 34.8 31.5 - 35.7 g/dL  ? RDW 12.3 11.6 - 15.4 %  ? Platelets 192 150 - 450 x10E3/uL  ? Neutrophils 61 Not Estab. %  ? Lymphs 28 Not Estab. %  ? Monocytes 8 Not Estab. %  ? Eos 2 Not Estab. %  ? Basos 1 Not Estab. %  ? Neutrophils Absolute 3.7 1.4 - 7.0 x10E3/uL  ? Lymphocytes Absolute 1.7 0.7 - 3.1 x10E3/uL  ? Monocytes Absolute 0.5 0.1 - 0.9 x10E3/uL  ? EOS (ABSOLUTE) 0.1 0.0 - 0.4 x10E3/uL  ? Basophils Absolute 0.1 0.0 - 0.2 x10E3/uL  ? Immature Granulocytes 0 Not Estab. %  ? Immature Grans (Abs) 0.0 0.0 - 0.1 x10E3/uL  ?Comprehensive metabolic panel  ?Result Value Ref Range  ? Glucose 82 70 - 99 mg/dL  ? BUN 9 6 - 24 mg/dL  ? Creatinine, Ser 0.84 0.76 - 1.27 mg/dL  ? eGFR 110 >59 mL/min/1.73  ? BUN/Creatinine Ratio 11 9 - 20  ? Sodium 137 134 - 144 mmol/L  ? Potassium 3.9 3.5 - 5.2 mmol/L  ? Chloride 98 96 - 106 mmol/L  ? CO2 21 20 - 29 mmol/L  ? Calcium 9.5 8.7 - 10.2 mg/dL  ? Total Protein 7.1 6.0 - 8.5 g/dL  ? Albumin 4.8 4.0 - 5.0 g/dL  ? Globulin, Total 2.3 1.5 - 4.5 g/dL  ? Albumin/Globulin Ratio 2.1 1.2 - 2.2  ? Bilirubin Total 0.6 0.0 - 1.2 mg/dL  ? Alkaline Phosphatase 85 44 - 121 IU/L  ? AST 38 0 - 40 IU/L  ? ALT 42 0 - 44 IU/L  ?PSA Total+%Free (Serial)  ?Result Value Ref Range  ? Prostate Specific Ag, Serum 0.5 0.0 - 4.0 ng/mL  ? PSA, Free 0.18 N/A ng/mL  ? PSA, Free Pct 36.0 %  ? ?   ? ? ?  Current Outpatient Medications:  ??  baclofen (LIORESAL) 10 MG tablet, Take 0.5-1 tablets (5-10 mg total) by mouth at bedtime as needed for muscle spasms., Disp: 30 each, Rfl: 3 ??   meloxicam (MOBIC) 15 MG tablet, Take 0.5-1 tablets (7.5-15 mg total) by mouth daily as needed for pain., Disp: 30 tablet, Rfl: 6  ? ? ?Assessment & Plan:  ? ? Shoulder pain / back pain ? Etiology  ?Check xrays of such  ?? Sec to tendinitis of the shoulder  ? PT helped pt . ?Will send off mobic for pt.  ? ? ?No orders of the defined types were placed in this encounter. ?  ? ?Meds ordered this encounter  ?Medications  ?? meloxicam (MOBIC) 15 MG tablet  ?  Sig: Take 0.5-1 tablets (7.5-15 mg total) by mouth daily as needed for pain.  ?  Dispense:  30 tablet  ?  Refill:  6  ?  ? ?Follow up plan: ?Return in about 1 year (around 05/20/2022). ? ? ? ?

## 2021-07-01 DIAGNOSIS — G8929 Other chronic pain: Secondary | ICD-10-CM | POA: Insufficient documentation

## 2022-02-18 ENCOUNTER — Emergency Department
Admission: EM | Admit: 2022-02-18 | Discharge: 2022-02-18 | Disposition: A | Discharge status: Home / Self Care | Attending: Emergency Medicine | Admitting: Emergency Medicine

## 2022-02-18 ENCOUNTER — Ambulatory Visit: Payer: Federal, State, Local not specified - PPO | Admitting: Physician Assistant

## 2022-02-18 ENCOUNTER — Emergency Department

## 2022-02-18 ENCOUNTER — Other Ambulatory Visit: Payer: Self-pay

## 2022-02-18 ENCOUNTER — Encounter: Payer: Self-pay | Admitting: Emergency Medicine

## 2022-02-18 DIAGNOSIS — M50323 Other cervical disc degeneration at C6-C7 level: Secondary | ICD-10-CM | POA: Diagnosis not present

## 2022-02-18 DIAGNOSIS — M25512 Pain in left shoulder: Secondary | ICD-10-CM | POA: Diagnosis not present

## 2022-02-18 DIAGNOSIS — M5412 Radiculopathy, cervical region: Secondary | ICD-10-CM | POA: Diagnosis not present

## 2022-02-18 DIAGNOSIS — M50322 Other cervical disc degeneration at C5-C6 level: Secondary | ICD-10-CM | POA: Diagnosis not present

## 2022-02-18 DIAGNOSIS — M25511 Pain in right shoulder: Secondary | ICD-10-CM | POA: Diagnosis not present

## 2022-02-18 MED ORDER — BACLOFEN 10 MG PO TABS: 10.0000 mg | ORAL_TABLET | Freq: Three times a day (TID) | ORAL | 0 refills | Status: DC

## 2022-02-18 MED ORDER — OXYCODONE-ACETAMINOPHEN 5-325 MG PO TABS
1.0000 | ORAL_TABLET | Freq: Once | ORAL | Status: AC
  Administered 2022-02-18: 1 via ORAL
  Filled 2022-02-18: qty 1

## 2022-02-18 MED ORDER — PREDNISONE 10 MG (48) PO TBPK: ORAL_TABLET | ORAL | 0 refills | Status: DC

## 2022-02-18 MED ORDER — GABAPENTIN 300 MG PO CAPS: 300.0000 mg | ORAL_CAPSULE | Freq: Three times a day (TID) | ORAL | 2 refills | Status: DC

## 2022-02-18 MED ORDER — OXYCODONE-ACETAMINOPHEN 5-325 MG PO TABS: 1.0000 | ORAL_TABLET | ORAL | 0 refills | Status: DC | PRN

## 2022-02-18 NOTE — Discharge Instructions (Signed)
Follow-up with neurosurgery.  Please call for an appointment.  Take your medications as prescribed.  Do not take the narcotic if you are going to be driving a vehicle.  Will make you drowsy and you would obtain a DWI if operating a vehicle. Apply ice to the posterior neck and shoulder.  Return emergency department if worsening.

## 2022-02-18 NOTE — ED Provider Notes (Signed)
[facility] Provider Note    Event Date/Time   First MD Initiated Contact with Patient 02/18/22 1244     (approximate)   History     HPI  Gary Gray is a 46 y.o. male with history of arthritis in the shoulder, back pain secondary to old compression fractures presents emergency department with left-sided shoulder pain with radiation all the way to the left fourth and fifth fingers.  States that sharp stinging pain.  Is better when he stands when he is sitting.  No new injury      Physical Exam   Triage Vital Signs: ED Triage Vitals  Enc Vitals Group     BP 02/18/22 1204 (!) 148/104     Pulse Rate 02/18/22 1204 87     Resp 02/18/22 1204 20     Temp 02/18/22 1212 97.6 F (36.4 C)     Temp src --      SpO2 02/18/22 1204 96 %     Weight 02/18/22 1213 200 lb (90.7 kg)     Height 02/18/22 1213 5\' 8"  (1.727 m)     Head Circumference --      Peak Flow --      Pain Score 02/18/22 1212 5     Pain Loc --      Pain Edu? --      Excl. in GC? --     Most recent vital signs: Vitals:   02/18/22 1204 02/18/22 1212  BP: (!) 148/104   Pulse: 87   Resp: 20   Temp:  97.6 F (36.4 C)  SpO2: 96%      General: Awake, no distress.   CV:  Good peripheral perfusion. regular rate and  rhythm Resp:  Normal effort.  Abd:  No distention.   Other:  C-spine minimally tender in the midline, no spasm noted left shoulder, grips equal bilaterally   ED Results / Procedures / Treatments   Labs (all labs ordered are listed, but only abnormal results are displayed) Labs Reviewed - No data to display   EKG     RADIOLOGY C-spine x-ray    PROCEDURES:   Procedures   MEDICATIONS ORDERED IN ED: Medications  oxyCODONE-acetaminophen (PERCOCET/ROXICET) 5-325 MG per tablet 1 tablet (1 tablet Oral Given 02/18/22 1309)     IMPRESSION / MDM / ASSESSMENT AND PLAN / ED COURSE  I reviewed the triage vital signs and the nursing notes.                               Differential diagnosis includes, but is not limited to, cervical radiculopathy, brachial plexus injury, neck pain, shoulder spasm  Patient's presentation is most consistent with acute complicated illness / injury requiring diagnostic workup.   X-rays C-spine, pain medication ordered  Plan at this time is to discharge with steroids, muscle relaxer, gabapentin and follow-up with neurosurgery   X-ray C-spine reviewed after findings having spurs along C6 examined which with the same pathway as the nerve pain that he is having.  He was given a prescription for steroids, gabapentin, baclofen, and Percocet.  He is to follow-up with Dr. 02/20/22.  He is agreeable treatment plan.  He is discharged stable condition.   FINAL CLINICAL IMPRESSION(S) / ED DIAGNOSES   Final diagnoses:  Cervical radiculopathy     Rx / DC Orders   ED Discharge Orders  Ordered    gabapentin (NEURONTIN) 300 MG capsule  3 times daily        02/18/22 1300    predniSONE (STERAPRED UNI-PAK 48 TAB) 10 MG (48) TBPK tablet        02/18/22 1300    oxyCODONE-acetaminophen (PERCOCET) 5-325 MG tablet  Every 4 hours PRN        02/18/22 1300    baclofen (LIORESAL) 10 MG tablet  3 times daily        02/18/22 1317             Note:  This document was prepared using Dragon voice recognition software and may include unintentional dictation errors.    Versie Starks, PA-C 02/18/22 1317    Lavonia Drafts, MD 02/18/22 1344

## 2022-02-18 NOTE — ED Triage Notes (Signed)
Pt c/o left shoulder pain that radiates to tingling in left hand. Denies injury. States if sits down then pain worsens and causes pt to feel sick to stomach.  EKG performed. No further orders per Dr Sidney Ace

## 2022-02-21 ENCOUNTER — Ambulatory Visit: Payer: Self-pay

## 2022-02-21 ENCOUNTER — Telehealth: Payer: Self-pay

## 2022-02-21 ENCOUNTER — Ambulatory Visit: Payer: Self-pay | Admitting: *Deleted

## 2022-02-21 NOTE — Telephone Encounter (Signed)
LVM asking patient to call back to schedule an appointment with any provider that has an opening

## 2022-02-21 NOTE — Telephone Encounter (Signed)
     Chief Complaint: Neck and left am pain Symptoms: Seen in ED 02/18/22 Frequency: Above Pertinent Negatives: Patient denies  Disposition: [] ED /[] Urgent Care (no appt availability in office) / [] Appointment(In office/virtual)/ []  [facility]/ [] Home Care/ [] Refused Recommended Disposition /[] [place] [facility]/ [x]  Follow-up with PCP Additional Notes: Pt. Asking to be worked in today for pain medication and work note. Please advise. Practice currently closed for lunch. Answer Assessment - Initial Assessment Questions 1. ONSET: "When did the pain begin?"      Friday 2. LOCATION: "Where does it hurt?"      Neck and left arm pain 3. PATTERN "Does the pain come and go, or has it been constant since it started?"      Comes and goes 4. SEVERITY: "How bad is the pain?"  (Scale 1-10; or mild, moderate, severe)   - NO PAIN (0): no pain or only slight stiffness    - MILD (1-3): doesn't interfere with normal activities    - MODERATE (4-7): interferes with normal activities or awakens from sleep    - SEVERE (8-10):  excruciating pain, unable to do any normal activities      5 5. RADIATION: "Does the pain go anywhere else, shoot into your arms?"     Left arm 6. CORD SYMPTOMS: "Any weakness or numbness of the arms or legs?"     No 7. CAUSE: "What do you think is causing the neck pain?"     Unsure 8. NECK OVERUSE: "Any recent activities that involved turning or twisting the neck?"     No 9. OTHER SYMPTOMS: "Do you have any other symptoms?" (e.g., headache, fever, chest pain, difficulty breathing, neck swelling)     No 10. PREGNANCY: "Is there any chance you are pregnant?" "When was your last menstrual period?"       N/a  Protocols used: Neck Pain or Stiffness-A-AH

## 2022-02-21 NOTE — Telephone Encounter (Signed)
He will need to be seen for this. I have openings on Wednesday if no one else has anything sooner

## 2022-02-21 NOTE — Telephone Encounter (Signed)
-----   Message from Rockey Situ sent at 02/21/2022  8:49 AM EST ----- Regarding: hospital fu Contact: [phone] Patient seen in the ER on Friday 02/18/2022 for cervical radiculopathy. He was only given a work note to be out until today. He feels that he can not go back to work tomorrow. 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 He is currently out of pain medication. Can he be worked in? Dr.Yarbrough sent me a message to f/u with any PA at next opening. Nothing available until 03/09/2022.

## 2022-02-21 NOTE — Telephone Encounter (Signed)
Patient confirmed appt for 02/22/2022.

## 2022-02-21 NOTE — Telephone Encounter (Signed)
Patient returned call- he will need note for work and wants his appointment as soon as possible- patient advised call was routed to provider and office will call him back- patient states he just got notification the neurosurgeon can see him tomorrow am. Advised patient I was glad he will be seen- I will let his provider know.

## 2022-02-21 NOTE — Telephone Encounter (Signed)
Provider notified

## 2022-02-21 NOTE — Progress Notes (Unsigned)
Referring Physician:  Jene Every, MD [address] [place],  [place] [address]  Primary Physician:  [facility]  History of Present Illness: 02/22/2022 Mr. Gary Gray has a history of chronic back pain.   He works as a Museum/gallery curator.   He was seen in ED on 02/18/22 for neck and left arm pain that started 2 days prior. No known injury.   He continues with constant neck pain that radiates into left shoulder and into left hand. He has numbness in ring and small finger on left. No right arm pain. Pain is worse with moving his arm. No weakness in left arm. He has been out of work since ED visit.    He has difficulty speaking- he cannot put a full sentence together. His wife noticed this after starting gabapentin. He took his last dose of gabapentin yesterday. She said his speech was some better last night, but worse when he work up this morning. He was able to text her that the more he tries to think, the worse it is. He feels like his "brain is moving too fast." He normally has no issues with speech.   He was given prednisone, neurontin, percocet, and baclofen from the ED.   He smokes.   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  The symptoms are causing a significant impact on the patient's life.   Review of Systems:  A 10 point review of systems is negative, except for the pertinent positives and negatives detailed in the HPI.  Past Medical History: No past medical history on file.  Past Surgical History: No past surgical history on file.  Allergies: Allergies as of 02/22/2022   (No Known Allergies)    Medications: Outpatient Encounter Medications as of 02/22/2022  Medication Sig   baclofen (LIORESAL) 10 MG tablet  Take 1 tablet (10 mg total) by mouth 3 (three) times daily for 7 days.   gabapentin (NEURONTIN) 300 MG capsule Take 1 capsule (300 mg total) by mouth 3 (three) times daily.   meloxicam (MOBIC) 15 MG tablet Take 0.5-1 tablets (7.5-15 mg total) by mouth daily as needed for pain.   oxyCODONE-acetaminophen (PERCOCET) 5-325 MG tablet Take 1 tablet by mouth every 4 (four) hours as needed for severe pain.   predniSONE (STERAPRED UNI-PAK 48 TAB) 10 MG (48) TBPK tablet Take 6 pills for 2 days then decrease by 1 pill every 2 days   No facility-administered encounter medications on file as of 02/22/2022.    Social History: Social History   Tobacco Use   Smoking status: Every Day    Packs/day: 1.50    Types: Cigarettes    Passive exposure: Current   Smokeless tobacco: Never  Vaping Use   Vaping Use: Never used  Substance Use Topics   Alcohol use: Yes    Alcohol/week: 3.0 standard drinks of alcohol    Types: 3 Cans of beer per week    Comment: per night   Drug use: No    Family Medical History: Family History  Problem Relation Age of Onset   Diabetes Mother    Heart disease Father    Atrial fibrillation Father    Diabetes Maternal Grandfather    Breast cancer Paternal Grandmother     Physical Examination: There were no vitals filed for this  visit.  General: Patient is well developed, well nourished, calm, collected, and in no apparent distress. Attention to examination is appropriate.  Respiratory: Patient is breathing without any difficulty.   NEUROLOGICAL:     Awake, alert, oriented to person, place, and time.  Speech is clear and fluent. Fund of knowledge is appropriate.   Cranial Nerves: Pupils equal round and reactive to light.  Facial tone is symmetric.  Facial sensation is symmetric.   He has no posterior cervical tenderness. Mild left trapezial tenderness.   He holds his left arm cradled against his chest.   No abnormal lesions on exposed skin.   Strength: Side  Biceps Triceps Deltoid Interossei Grip Wrist Ext. Wrist Flex.  R 5 5 5 5 5 5 5   L 5 5 5 5 5 5 5    Side Iliopsoas Quads Hamstring PF DF EHL  R 5 5 5 5 5 5   L 5 5 5 5 5 5    Reflexes are 2+ and symmetric at the biceps, triceps, brachioradialis, patella and achilles.   Hoffman's is absent.  Clonus is not present.   Bilateral upper and lower extremity sensation is intact to light touch, but diminished in ulnar aspect left hand.   Reasonable ROM of both shoulders with no pain.   He has slow gait.   Medical Decision Making  Imaging: Cervical xrays dated 02/18/22:  FINDINGS: Prevertebral soft tissues are within normal limits. Cervical spine is visualized from the skull base through the cervicothoracic junction. Vertebral body heights are maintained. Straightening of the normal cervical lordosis is present. Chronic loss of disc space is present with right greater than left uncovertebral spurring at C5-6 and left greater than right uncovertebral spurring at C6-7. This could impact the C6 and C7 nerve roots.   IMPRESSION: 1. Chronic degenerative disc disease and uncovertebral spurring at C5-6 and C6-7. This could impact the C6 and C7 nerve roots. 2. No acute abnormality.     Electronically Signed   By: M.D.   On: 02/18/2022 13:06        I have personally reviewed the images and agree with the above interpretation.  Assessment and Plan: Mr. Memmer is a pleasant 46 y.o. male with constant neck pain that radiates into left shoulder and into left hand for almost a week. He has numbness in ring and small finger on left. No right arm pain.    He has difficulty speaking- he cannot put a full sentence together today. His wife noticed this after starting gabapentin. He took his last dose of gabapentin yesterday. She said his speech was some better last night, but worse when he work up this morning.   Known cervical DDD/spondylosis C5-C7.   Treatment options  discussed with patient and following plan made:   - Reviewed with Dr. Marin Roberts. We recommend he go to ED for evaluation of possible stroke. BP is also 168/100.  - Stop gabapentin.  - He will need cervical MRI, but will hold on this pending evaluation in the ED.  - He was given work note- he has been out since 12/15 and is out pending further notice.  - Will send him a message on MyChart tomorrow to check on him.  - He and his wife were headed to ED at [facility] when they left the clinic.   I spent a total of 30 minutes in face-to-face and non-face-to-face activities related to this patient's care today including review of outside records, review of imaging, review of  symptoms, physical exam, discussion of differential diagnosis, discussion of treatment options, and documentation.   Thank you for involving me in the care of this patient.   Drake Leach PA-C Dept. of Neurosurgery

## 2022-02-21 NOTE — Telephone Encounter (Signed)
Summary: Pain in shoulder/arm   Patient was seen in ED at [facility] on 12/15 for pain in shoulder that was radiating down his arm. Patient states that he is supposed to go back to work tomorrow but is still in a lot of pain and is out of medications. Patient states that he cannot see the neurologists for another 2 weeks and next appointment with pcp is on Wednesday.          Called [phone] to review shoulder pain and medication request. No answer, LVMTCB [phone].

## 2022-02-21 NOTE — Telephone Encounter (Signed)
You can schedule him to see me tomorrow at 9:30. Looks like it is open. Can discuss work note, pain medications tomorrow.

## 2022-02-22 ENCOUNTER — Emergency Department

## 2022-02-22 ENCOUNTER — Other Ambulatory Visit: Payer: Self-pay

## 2022-02-22 ENCOUNTER — Encounter: Payer: Self-pay | Admitting: Orthopedic Surgery

## 2022-02-22 ENCOUNTER — Ambulatory Visit: Payer: Federal, State, Local not specified - PPO | Admitting: Orthopedic Surgery

## 2022-02-22 ENCOUNTER — Observation Stay
Admission: EM | Admit: 2022-02-22 | Discharge: 2022-02-23 | Disposition: A | Discharge status: Home / Self Care | Attending: Internal Medicine | Admitting: Internal Medicine

## 2022-02-22 ENCOUNTER — Observation Stay

## 2022-02-22 ENCOUNTER — Encounter: Payer: Self-pay | Admitting: Emergency Medicine

## 2022-02-22 VITALS — BP 168/100 | Ht 68.0 in | Wt 213.6 lb

## 2022-02-22 DIAGNOSIS — M4722 Other spondylosis with radiculopathy, cervical region: Secondary | ICD-10-CM

## 2022-02-22 DIAGNOSIS — R4701 Aphasia: Principal | ICD-10-CM | POA: Insufficient documentation

## 2022-02-22 DIAGNOSIS — M47812 Spondylosis without myelopathy or radiculopathy, cervical region: Secondary | ICD-10-CM

## 2022-02-22 DIAGNOSIS — R479 Unspecified speech disturbances: Secondary | ICD-10-CM

## 2022-02-22 DIAGNOSIS — M5030 Other cervical disc degeneration, unspecified cervical region: Secondary | ICD-10-CM

## 2022-02-22 DIAGNOSIS — Z79899 Other long term (current) drug therapy: Secondary | ICD-10-CM | POA: Insufficient documentation

## 2022-02-22 DIAGNOSIS — G9389 Other specified disorders of brain: Secondary | ICD-10-CM | POA: Diagnosis not present

## 2022-02-22 DIAGNOSIS — M542 Cervicalgia: Secondary | ICD-10-CM | POA: Insufficient documentation

## 2022-02-22 DIAGNOSIS — R29818 Other symptoms and signs involving the nervous system: Secondary | ICD-10-CM | POA: Diagnosis not present

## 2022-02-22 DIAGNOSIS — D72829 Elevated white blood cell count, unspecified: Secondary | ICD-10-CM | POA: Insufficient documentation

## 2022-02-22 DIAGNOSIS — R9431 Abnormal electrocardiogram [ECG] [EKG]: Secondary | ICD-10-CM | POA: Diagnosis not present

## 2022-02-22 DIAGNOSIS — I6523 Occlusion and stenosis of bilateral carotid arteries: Secondary | ICD-10-CM | POA: Diagnosis not present

## 2022-02-22 DIAGNOSIS — R4781 Slurred speech: Secondary | ICD-10-CM | POA: Diagnosis not present

## 2022-02-22 DIAGNOSIS — R030 Elevated blood-pressure reading, without diagnosis of hypertension: Secondary | ICD-10-CM | POA: Diagnosis present

## 2022-02-22 DIAGNOSIS — I10 Essential (primary) hypertension: Secondary | ICD-10-CM | POA: Insufficient documentation

## 2022-02-22 DIAGNOSIS — M5412 Radiculopathy, cervical region: Secondary | ICD-10-CM

## 2022-02-22 DIAGNOSIS — R519 Headache, unspecified: Secondary | ICD-10-CM | POA: Diagnosis not present

## 2022-02-22 LAB — COMPREHENSIVE METABOLIC PANEL
ALT: 26 U/L (ref 0–44)
AST: 22 U/L (ref 15–41)
Albumin: 4.6 g/dL (ref 3.5–5.0)
Alkaline Phosphatase: 49 U/L (ref 38–126)
Anion gap: 8 (ref 5–15)
BUN: 21 mg/dL — ABNORMAL HIGH (ref 6–20)
CO2: 25 mmol/L (ref 22–32)
Calcium: 9.6 mg/dL (ref 8.9–10.3)
Chloride: 105 mmol/L (ref 98–111)
Creatinine, Ser: 0.78 mg/dL (ref 0.61–1.24)
GFR, Estimated: >60 mL/min (ref >60)
Glucose, Bld: 97 mg/dL (ref 70–99)
Potassium: 4.4 mmol/L (ref 3.5–5.1)
Sodium: 138 mmol/L (ref 135–145)
Total Bilirubin: 0.8 mg/dL (ref 0.3–1.2)
Total Protein: 7.5 g/dL (ref 6.5–8.1)

## 2022-02-22 LAB — CBC WITH DIFFERENTIAL/PLATELET
Abs Immature Granulocytes: 0.6 10*3/uL — ABNORMAL HIGH (ref 0.00–0.07)
Basophils Absolute: 0.1 10*3/uL (ref 0.0–0.1)
Basophils Relative: 0 %
Eosinophils Absolute: 0 10*3/uL (ref 0.0–0.5)
Eosinophils Relative: 0 %
HCT: 45.8 % (ref 39.0–52.0)
Hemoglobin: 15.6 g/dL (ref 13.0–17.0)
Immature Granulocytes: 4 %
Lymphocytes Relative: 13 %
Lymphs Abs: 2.2 10*3/uL (ref 0.7–4.0)
MCH: 30.7 pg (ref 26.0–34.0)
MCHC: 34.1 g/dL (ref 30.0–36.0)
MCV: 90.2 fL (ref 80.0–100.0)
Monocytes Absolute: 1 10*3/uL (ref 0.1–1.0)
Monocytes Relative: 6 %
Neutro Abs: 12.7 10*3/uL — ABNORMAL HIGH (ref 1.7–7.7)
Neutrophils Relative %: 77 %
Platelets: 268 10*3/uL (ref 150–400)
RBC: 5.08 MIL/uL (ref 4.22–5.81)
RDW: 11.8 % (ref 11.5–15.5)
WBC: 16.6 10*3/uL — ABNORMAL HIGH (ref 4.0–10.5)
nRBC: 0 % (ref 0.0–0.2)

## 2022-02-22 MED ORDER — ASPIRIN 81 MG PO TBEC
81.0000 mg | DELAYED_RELEASE_TABLET | Freq: Every day | ORAL | Status: DC
  Administered 2022-02-23: 81 mg via ORAL
  Filled 2022-02-22: qty 1

## 2022-02-22 MED ORDER — HYDRALAZINE HCL 20 MG/ML IJ SOLN: 10.0000 mg | Freq: Four times a day (QID) | INTRAMUSCULAR | Status: DC | PRN

## 2022-02-22 MED ORDER — OXYCODONE-ACETAMINOPHEN 5-325 MG PO TABS
1.0000 | ORAL_TABLET | Freq: Four times a day (QID) | ORAL | Status: DC | PRN
  Administered 2022-02-22: 1 via ORAL
  Filled 2022-02-22: qty 1

## 2022-02-22 MED ORDER — ASPIRIN 81 MG PO TBEC: 81.0000 mg | DELAYED_RELEASE_TABLET | Freq: Every day | ORAL | Status: DC

## 2022-02-22 MED ORDER — GADOBUTROL 1 MMOL/ML IV SOLN
9.0000 mL | Freq: Once | INTRAVENOUS | Status: AC | PRN
  Administered 2022-02-22: 9 mL via INTRAVENOUS

## 2022-02-22 MED ORDER — ASPIRIN 81 MG PO CHEW
81.0000 mg | CHEWABLE_TABLET | Freq: Once | ORAL | Status: AC
  Administered 2022-02-22: 81 mg via ORAL
  Filled 2022-02-22: qty 1

## 2022-02-22 NOTE — ED Provider Triage Note (Signed)
  Emergency Medicine Provider Triage Evaluation Note  Gary Gray , a 46 y.o.male,  was evaluated in triage.  Pt complains of slurred speech.  He is joined by his wife, who states that the patient began having slurred speech yesterday around noon.  She states that it got better briefly after discontinuing his gabapentin, however is back to being just as bad.  Patient states he is having difficulty finding an articulating words.   Review of Systems  Positive: Dysarthria, slurred speech. Negative: Denies fever, chest pain, vomiting  Physical Exam  There were no vitals filed for this visit. Gen:   Awake, no distress   Resp:  Normal effort  MSK:   Moves extremities without difficulty  Other:  No significant facial droop.  No weakness in upper or lower extremities.  Patient does appear to have difficulty articulating speech.  Medical Decision Making  Given the patient's initial medical screening exam, the following diagnostic evaluation has been ordered. The patient will be placed in the appropriate treatment space, once one is available, to complete the evaluation and treatment. I have discussed the plan of care with the patient and I have advised the patient that an ED physician or mid-level practitioner will reevaluate their condition after the test results have been received, as the results may give them additional insight into the type of treatment they may need.    Diagnostics: Head CT, labs, EKG  Treatments: none immediately   Varney Daily, [place] 02/22/22 1019

## 2022-02-22 NOTE — ED Notes (Signed)
Pt to mri 

## 2022-02-22 NOTE — H&P (Signed)
History and Physical    Patient: Gary Gray:[id number] DOB: 16-Mar-1975 DOA: 02/22/2022 DOS: the patient was seen and examined on 02/22/2022 PCP: [facility]  Patient coming from: home  Chief Complaint:  Chief Complaint  Patient presents with   Aphasia   HPI: Gary Gray with medical history significant of low back pain comes to the emergency room after he was evaluated by neurosurgery as outpatient for constant neck pain that radiates to his left shoulder and left hand for almost a week. Patient was last week December 15 seen in the emergency room was prescribed some gabapentin, steroids, baclofen, and Percocet. He was asked to follow-up with Dr. Leonie Douglas neurosurgery.  Patient was sent to the emergency room since he started experiencing difficulty speaking. He cannot put a full sentence. He knows what he has to say but cannot go get words out. Patient notice this happened after he took gabapentin.  He came to the emergency and workup so far with CT had an MRI brain negative for stroke.  It is being admitted for further evaluation and expressive aphasia.  ER physician discussed with neurology Dr. Selina Cooley and she recommends overnight observation and further workup. Patient received aspirin 81 mg in the ER.  Review of Systems: As mentioned in the history of present illness. All other systems reviewed and are negative. History reviewed. No pertinent past medical history. History reviewed. No pertinent surgical history. Social History:  reports that he has been smoking cigarettes. He has been smoking an average of 1.5 packs per day. He has been exposed to tobacco smoke. He has never used smokeless tobacco. He reports current alcohol use of about 3.0 standard drinks of alcohol per week. He reports that he does not use drugs.  No Known Allergies  Family History  Problem Relation Age of Onset   Diabetes Mother    Heart disease Father    Atrial  fibrillation Father    Diabetes Maternal Grandfather    Breast cancer Paternal Grandmother     Prior to Admission medications   Medication Sig Start Date End Date Taking? Authorizing Provider  baclofen (LIORESAL) 10 MG tablet Take 1 tablet (10 mg total) by mouth 3 (three) times daily for 7 days. 02/18/22 02/25/22  Fisher, Roselyn Bering, PA-C  meloxicam (MOBIC) 15 MG tablet Take 0.5-1 tablets (7.5-15 mg total) by mouth daily as needed for pain. 05/19/21   Vigg, Avanti, MD  oxyCODONE-acetaminophen (PERCOCET) 5-325 MG tablet Take 1 tablet by mouth every 4 (four) hours as needed for severe pain. 02/18/22 02/18/23  Fisher, Roselyn Bering, PA-C  predniSONE (STERAPRED UNI-PAK 48 TAB) 10 MG (48) TBPK tablet Take 6 pills for 2 days then decrease by 1 pill every 2 days 02/18/22   Faythe Ghee, PA-C    Physical Exam: Vitals:   02/22/22 1022 02/22/22 1049 02/22/22 1311  BP: (!) 158/112  138/64  Pulse: 84  66  Resp: 16  18  Temp: 98.2 F (36.8 C)  98.5 F (36.9 C)  TempSrc: Oral    SpO2: 97%  98%  Weight:  90.7 kg   Height:  5\' 8"  (1.727 m)    Physical Activity: Not on file   Physical Exam Constitutional:      Appearance: Normal appearance.  HENT:     Head: Normocephalic and atraumatic.  Eyes:     Extraocular Movements: Extraocular movements intact.     Pupils: Pupils are equal, round, and reactive to light.  Cardiovascular:     Rate and Rhythm: Normal rate and regular rhythm.  Pulmonary:     Effort: Pulmonary effort is normal.     Breath sounds: Normal breath sounds.  Musculoskeletal:        General: Normal range of motion.  Skin:    General: Skin is warm and dry.  Neurological:     General: No focal deficit present.     Mental Status: He is alert and oriented to person, place, and time.     Comments: Expressive aphasia      Assessment and Plan:  Gary Gray with medical history significant of low back pain comes to the emergency room after he was evaluated by  neurosurgery as outpatient for constant neck pain that radiates to his left shoulder and left hand for almost a week. Patient was sent to the emergency room since he started experiencing difficulty speaking. He cannot put a full sentence. He knows what he has to say but cannot go get words out. Patient notice this happened after he took gabapentin.  Expressive aphasia -- differential TIA versus side effect of Neurontin -- unclear etiology -- patient does not have any other neural deficit -- MRI brain negative for stroke -- continue aspirin 81 mg daily -- neurology Dr. Ayesha Rumpf stack aware of patient. She recommends  overnight observation. She will order further imaging studies tomorrow. -- EEG of the brain will be ordered by neurology  Neck pain/cervical spur -- PRN Tylenol  Elevated blood pressure with a diagnosis of hypertension. -- Will place on hydralazine PRN. -- If verse strongly remains elevated consider starting antihypertensives   Advance Care Planning:   Code Status: Full Code full  Consults: neurology  Family Communication: none during my evaluation. Patient's wife was not in the ER  Severity of Illness: The appropriate patient status for this patient is OBSERVATION. Observation status is judged to be reasonable and necessary in order to provide the required intensity of service to ensure the patient's safety. The patient's presenting symptoms, physical exam findings, and initial radiographic and laboratory data in the context of their medical condition is felt to place them at decreased risk for further clinical deterioration. Furthermore, it is anticipated that the patient will be medically stable for discharge from the hospital within 2 midnights of admission.   Author: Enedina Finner, MD 02/22/2022 5:32 PM  For on call review [email or web address].

## 2022-02-22 NOTE — ED Notes (Signed)
Pt was here last Friday for sharp pain/numbness right arm to right lower back. Xray showed pinched nerve. Sent home with steroids and follow-up with neurology. Today he had expressive aphasia. Currently denying nausea but still has expressive aphasia. Can say one word sentences. Performed NIH scale.

## 2022-02-22 NOTE — ED Triage Notes (Signed)
Pt via POV from home. Pt c/o expressive aphasia, nausea, R sided arm numbness. States that LKW was 2:00pm yesterday. Denies hx of HTN. Pt was seeing neurosurgery yesterday for neck pain. Pt is A&Ox4 and NAD

## 2022-02-22 NOTE — ED Provider Notes (Signed)
[facility] Provider Note   Event Date/Time   First MD Initiated Contact with Patient 02/22/22 1519     (approximate) History  Aphasia  HPI Gary Gray is a 46 y.o. male with no stated past medical history but recently treated for presumed cervical spinal muscular strain with gabapentin, prednisone, and muscle relaxants who presents for 2 days of aphasia.  Patient states that this initially started yesterday at around 2:00 when he had the urge to speak but was unable to form words other than yes and no.  Patient states that he initially improved overnight but worsened again this morning.  Patient states that he never came back to his full baseline but was "able to speak more sentences" last night that decreased this morning. ROS: Patient currently denies any vision changes, tinnitus, difficulty speaking, facial droop, sore throat, chest pain, shortness of breath, abdominal pain, nausea/vomiting/diarrhea, dysuria, or weakness/numbness/paresthesias in any extremity   Physical Exam  Triage Vital Signs: ED Triage Vitals  Enc Vitals Group     BP 02/22/22 1022 (!) 158/112     Pulse Rate 02/22/22 1022 84     Resp 02/22/22 1022 16     Temp 02/22/22 1022 98.2 F (36.8 C)     Temp Source 02/22/22 1022 Oral     SpO2 02/22/22 1022 97 %     Weight 02/22/22 1049 200 lb (90.7 kg)     Height 02/22/22 1049 5\' 8"  (1.727 m)     Head Circumference --      Peak Flow --      Pain Score 02/22/22 1049 7     Pain Loc --      Pain Edu? --      Excl. in GC? --    Most recent vital signs: Vitals:   02/22/22 1311 02/22/22 2222  BP: 138/64 119/89  Pulse: 66 67  Resp: 18 15  Temp: 98.5 F (36.9 C) 97.9 F (36.6 C)  SpO2: 98% 97%   General: Awake, oriented x4. CV:  Good peripheral perfusion.  Resp:  Normal effort.  Abd:  No distention.  Other:  Middle-aged Caucasian male laying in stretcher in no acute distress.  Patient is able to name monosyllabic nouns and say yes and no  but is unable to answer any other questions fully.  Otherwise patient is neurologically intact with full strength and sensation in all extremities and cranial nerves II through X intact ED Results / Procedures / Treatments  Labs (all labs ordered are listed, but only abnormal results are displayed) Labs Reviewed  COMPREHENSIVE METABOLIC PANEL - Abnormal; Notable for the following components:      Result Value   BUN 21 (*)    All other components within normal limits  CBC WITH DIFFERENTIAL/PLATELET - Abnormal; Notable for the following components:   WBC 16.6 (*)    Neutro Abs 12.7 (*)    Abs Immature Granulocytes 0.60 (*)    All other components within normal limits   EKG ED ECG REPORT I, 02/24/22, the attending physician, personally viewed and interpreted this ECG. Date: 02/22/2022 EKG Time: 1027 Rate: 75 Rhythm: normal sinus rhythm QRS Axis: normal Intervals: normal ST/T Wave abnormalities: normal Narrative Interpretation: no evidence of acute ischemia RADIOLOGY ED MD interpretation: CT of the head without contrast interpreted by me shows no evidence of acute abnormalities including no intracerebral hemorrhage, obvious masses, or significant edema -Agree with radiology assessment Official radiology report(s): MR BRAIN W CONTRAST  Result Date: 02/22/2022 CLINICAL DATA:  Neck pain radiating to left shoulder and hand. EXAM: MRI HEAD WITH CONTRAST TECHNIQUE: Multiplanar, multiecho pulse sequences of the brain and surrounding structures were obtained with intravenous contrast. CONTRAST:  80mL GADAVIST GADOBUTROL 1 MMOL/ML IV SOLN COMPARISON:  Same-day noncontrast brain MRI and noncontrast CT head FINDINGS: Brain: There is a 0.8 cm x 0.9 cm homogeneously enhancing extra-axial lesion overlying the right frontal lobe consistent with a meningioma. There is no mass effect on the underlying brain parenchyma. There is no other abnormal intracranial enhancement. There is no midline shift.  The remainder of the brain parenchyma was evaluated on the noncontrast brain MRI obtained earlier the same day. Vascular: The major vasculature enhances normally. Skull and upper cervical spine: Unremarkable. Sinuses/Orbits: Unremarkable. Other: None. IMPRESSION: Subcentimeter right frontal meningioma. No other abnormal intracranial enhancement. Electronically Signed   By: Lesia Hausen M.D.   On: 02/22/2022 20:10   MR Brain Wo Contrast (neuro protocol)  Result Date: 02/22/2022 CLINICAL DATA:  Neuro deficit, acute, stroke suspected. Slurred speech. EXAM: MRI HEAD WITHOUT CONTRAST TECHNIQUE: Multiplanar, multiecho pulse sequences of the brain and surrounding structures were obtained without intravenous contrast. COMPARISON:  Head CT 02/22/2022 FINDINGS: Brain: There is no evidence of an acute infarct, intracranial hemorrhage, mass, midline shift, or extra-axial fluid collection. The ventricles and sulci are normal. Scattered small T2 hyperintensities in the subcortical and deep cerebral white matter bilaterally are mildly advanced for age. Vascular: Small, poorly visualized distal left vertebral artery, possibly congenitally hypoplastic. Other major intracranial vascular flow voids are preserved. Skull and upper cervical spine: Unremarkable bone marrow signal. Sinuses/Orbits: Unremarkable orbits. Mucous retention cyst in the right maxillary sinus. Mild mucosal thickening in the frontal-ethmoid junction regions bilaterally. Clear mastoid air cells. Other: None. IMPRESSION: 1. No acute intracranial abnormality. 2. Mild cerebral white matter T2 signal changes, nonspecific though may reflect chronic small vessel ischemia, migraines, or prior infection/inflammation. Electronically Signed   By: Sebastian Ache M.D.   On: 02/22/2022 16:33   CT Head Wo Contrast  Result Date: 02/22/2022 CLINICAL DATA:  Neuro deficit, acute, stroke suspected. Slurred speech. EXAM: CT HEAD WITHOUT CONTRAST TECHNIQUE: Contiguous axial  images were obtained from the base of the skull through the vertex without intravenous contrast. RADIATION DOSE REDUCTION: This exam was performed according to the departmental dose-optimization program which includes automated exposure control, adjustment of the mA and/or kV according to patient size and/or use of iterative reconstruction technique. COMPARISON:  Head CT 05/08/2015 FINDINGS: Brain: There is no evidence of an acute infarct, intracranial hemorrhage, mass, midline shift, or extra-axial fluid collection. The ventricles and sulci are normal. Vascular: No hyperdense vessel. Skull: No fracture or suspicious osseous lesion. Sinuses/Orbits: Visualized paranasal sinuses and mastoid air cells are clear. Visualized portions of the orbits are unremarkable. Other: None. IMPRESSION: Negative head CT. Electronically Signed   By: Sebastian Ache M.D.   On: 02/22/2022 11:03   PROCEDURES: Critical Care performed: No .1-3 Lead EKG Interpretation  Performed by: Merwyn Katos, MD Authorized by: Merwyn Katos, MD     Interpretation: normal     ECG rate:  71   ECG rate assessment: normal     Rhythm: sinus rhythm     Ectopy: none     Conduction: normal    MEDICATIONS ORDERED IN ED: Medications  aspirin EC tablet 81 mg (81 mg Oral Not Given 02/22/22 1844)  hydrALAZINE (APRESOLINE) injection 10 mg (has no administration in time range)  oxyCODONE-acetaminophen (PERCOCET/ROXICET) 5-325 MG  per tablet 1 tablet (1 tablet Oral Given 02/22/22 2223)  aspirin chewable tablet 81 mg (81 mg Oral Given 02/22/22 1721)  gadobutrol (GADAVIST) 1 MMOL/ML injection 9 mL (9 mLs Intravenous Contrast Given 02/22/22 2002)   IMPRESSION / MDM / ASSESSMENT AND PLAN / ED COURSE  I reviewed the triage vital signs and the nursing notes.                             The patient is on the cardiac monitor to evaluate for evidence of arrhythmia and/or significant heart rate changes. Patient's presentation is most consistent with  acute presentation with potential threat to life or bodily function. Patient is a 46 year old male who presents with symptoms concerning for TIA/CVA Neurologic Deficits: Aphasia Last known Well Time: 1400 02/21/22 NIH Stroke Score: 1 Given History and Exam I have lower suspicion for infectious etiology, neurologic changes secondary to toxicologic ingestion, seizure, complex migraine. Presentation concerning for possible stroke requiring workup.  Workup: Labs: POC glucose, CBC, BMP, LFTs, Troponin, PT/INR, PTT, Type and Screen Other Diagnostics: ECG, CXR, non-contrast head CT followed by CTA brain and neck (to r/o large vessel occlusion amenable to thrombectomy) Interventions: Patient's not eligible for TPA due to resolution of symptoms prior to evaluation  Consult: hospitalist Disposition: Admit   FINAL CLINICAL IMPRESSION(S) / ED DIAGNOSES   Final diagnoses:  Aphasia   Rx / DC Orders   ED Discharge Orders     None      Note:  This document was prepared using Dragon voice recognition software and may include unintentional dictation errors.   Merwyn Katos, MD 02/22/22 [phone]

## 2022-02-23 ENCOUNTER — Observation Stay (HOSPITAL_BASED_OUTPATIENT_CLINIC_OR_DEPARTMENT_OTHER)
Admit: 2022-02-23 | Discharge: 2022-02-23 | Disposition: A | Discharge status: Home / Self Care | Attending: Internal Medicine | Admitting: Internal Medicine

## 2022-02-23 DIAGNOSIS — G459 Transient cerebral ischemic attack, unspecified: Secondary | ICD-10-CM | POA: Diagnosis not present

## 2022-02-23 DIAGNOSIS — M542 Cervicalgia: Secondary | ICD-10-CM

## 2022-02-23 DIAGNOSIS — R4701 Aphasia: Principal | ICD-10-CM

## 2022-02-23 DIAGNOSIS — R030 Elevated blood-pressure reading, without diagnosis of hypertension: Secondary | ICD-10-CM

## 2022-02-23 DIAGNOSIS — D72829 Elevated white blood cell count, unspecified: Secondary | ICD-10-CM

## 2022-02-23 LAB — ECHOCARDIOGRAM COMPLETE
AR max vel: 3.98 cm2
AV Area VTI: 4.03 cm2
AV Area mean vel: 3.86 cm2
AV Mean grad: 3 mmHg
AV Peak grad: 4.6 mmHg
Ao pk vel: 1.07 m/s
Area-P 1/2: 3.56 cm2
Height: 68 in
S' Lateral: 2.4 cm
Weight: 3200 oz

## 2022-02-23 LAB — CBC
HCT: 44.8 % (ref 39.0–52.0)
Hemoglobin: 14.7 g/dL (ref 13.0–17.0)
MCH: 30.2 pg (ref 26.0–34.0)
MCHC: 32.8 g/dL (ref 30.0–36.0)
MCV: 92.2 fL (ref 80.0–100.0)
Platelets: 255 10*3/uL (ref 150–400)
RBC: 4.86 MIL/uL (ref 4.22–5.81)
RDW: 12 % (ref 11.5–15.5)
WBC: 12.7 10*3/uL — ABNORMAL HIGH (ref 4.0–10.5)
nRBC: 0 % (ref 0.0–0.2)

## 2022-02-23 LAB — CREATININE, SERUM
Creatinine, Ser: 1.04 mg/dL (ref 0.61–1.24)
GFR, Estimated: >60 mL/min (ref >60)

## 2022-02-23 LAB — HIV ANTIBODY (ROUTINE TESTING W REFLEX): HIV Screen 4th Generation wRfx: NONREACTIVE

## 2022-02-23 MED ORDER — ACETAMINOPHEN 500 MG PO TABS: 1000.0000 mg | ORAL_TABLET | Freq: Four times a day (QID) | ORAL | Status: AC | PRN

## 2022-02-23 MED ORDER — ONDANSETRON HCL 4 MG PO TABS: 4.0000 mg | ORAL_TABLET | Freq: Four times a day (QID) | ORAL | Status: DC | PRN

## 2022-02-23 MED ORDER — SODIUM CHLORIDE 0.9 % IV SOLN: Freq: Once | INTRAVENOUS | Status: DC

## 2022-02-23 MED ORDER — ACETAMINOPHEN 325 MG PO TABS: 650.0000 mg | ORAL_TABLET | Freq: Four times a day (QID) | ORAL | Status: DC | PRN

## 2022-02-23 MED ORDER — ACETAMINOPHEN 650 MG RE SUPP: 650.0000 mg | Freq: Four times a day (QID) | RECTAL | Status: DC | PRN

## 2022-02-23 MED ORDER — ENOXAPARIN SODIUM 60 MG/0.6ML IJ SOSY: 0.5000 mg/kg | PREFILLED_SYRINGE | INTRAMUSCULAR | Status: DC

## 2022-02-23 MED ORDER — BISACODYL 5 MG PO TBEC: 5.0000 mg | DELAYED_RELEASE_TABLET | Freq: Every day | ORAL | Status: DC | PRN

## 2022-02-23 MED ORDER — ONDANSETRON HCL 4 MG/2ML IJ SOLN: 4.0000 mg | Freq: Four times a day (QID) | INTRAMUSCULAR | Status: DC | PRN

## 2022-02-23 NOTE — Progress Notes (Signed)
Eeg done 

## 2022-02-23 NOTE — Evaluation (Signed)
Occupational Therapy Evaluation Patient Details Name: Gary Gray MRN: [id number] DOB: December 01, 1975 Today's Date: 02/23/2022   History of Present Illness 46 y/o male presented to ED on 02/22/22 for expressive aphasia and R arm numbness. MRI showed R frontal meningioma. PMH: back pain   Clinical Impression   Patient seen for OT evaluation, wife present. Prior to admission pt lived with spouse, was independent for ADLs/IADLs, working full time, and still driving. Pt endorsing LBP that is chronic and recent pain that radiates down to LUE. Has numbness/tingling in LUE with complete numbness in RF and SF. Imaging confirms C-spine abnormalities. Pt continues to function at independent level for ADLs and functional mobility without an AD. He also continues to experience expressive aphasia. At this time, pt does not demonstrate any acute OT needs. Will complete orders. Pt could benefit from Outpatient OT upon discharge to address back pain and LUE functional use with ADL/IADL tasks.     Recommendations for follow up therapy are one component of a multi-disciplinary discharge planning process, led by the attending physician.  Recommendations may be updated based on patient status, additional functional criteria and insurance authorization.   Follow Up Recommendations  Outpatient OT     Assistance Recommended at Discharge PRN  Patient can return home with the following      Functional Status Assessment  Patient has not had a recent decline in their functional status  Equipment Recommendations  None recommended by OT    Recommendations for Other Services       Precautions / Restrictions Precautions Precautions: None Restrictions Weight Bearing Restrictions: No      Mobility Bed Mobility Overal bed mobility: Independent                  Transfers Overall transfer level: Independent Equipment used: None                      Balance Overall balance assessment:  Independent                                         ADL either performed or assessed with clinical judgement   ADL Overall ADL's : Independent                                             Vision Patient Visual Report: No change from baseline Additional Comments: wears contacts     Perception     Praxis      Pertinent Vitals/Pain Pain Assessment Pain Assessment: Faces Faces Pain Scale: Hurts a little bit Pain Location: LBP (chronic), LUE Pain Descriptors / Indicators: Radiating, Discomfort, Tingling, Numbness Pain Intervention(s): Monitored during session     Hand Dominance Right   Extremity/Trunk Assessment Upper Extremity Assessment Upper Extremity Assessment: LUE deficits/detail LUE Deficits / Details: pt endorsing numbness/tingling in LUE, RF and SF completely numb LUE Sensation: decreased light touch   Lower Extremity Assessment Lower Extremity Assessment: Overall WFL for tasks assessed   Cervical / Trunk Assessment Cervical / Trunk Assessment: Normal   Communication Communication Communication: Expressive difficulties   Cognition Arousal/Alertness: Awake/alert Behavior During Therapy: WFL for tasks assessed/performed Overall Cognitive Status: Within Functional Limits for tasks assessed  General Comments       Exercises Other Exercises Other Exercises: OT provided education re: role of OT, OT POC, post acute recs, sitting up for all meals, EOB/OOB mobility with assistance, home/fall safety.     Shoulder Instructions      Home Living Family/patient expects to be discharged to:: Private residence Living Arrangements: Spouse/significant other Available Help at Discharge: Family;Available PRN/intermittently Type of Home: House       Home Layout: Multi-level     Bathroom Shower/Tub: Producer, television/film/video: Standard                Prior  Functioning/Environment Prior Level of Function : Independent/Modified Independent;Working/employed;Driving                        OT Problem List: Impaired sensation;Pain;Decreased coordination      OT Treatment/Interventions:      OT Goals(Current goals can be found in the care plan section) Acute Rehab OT Goals Patient Stated Goal: go home OT Goal Formulation: All assessment and education complete, DC therapy  OT Frequency:      Co-evaluation              AM-PAC OT "6 Clicks" Daily Activity     Outcome Measure Help from another person eating meals?: None Help from another person taking care of personal grooming?: None Help from another person toileting, which includes using toliet, bedpan, or urinal?: None Help from another person bathing (including washing, rinsing, drying)?: None Help from another person to put on and taking off regular upper body clothing?: None Help from another person to put on and taking off regular lower body clothing?: None 6 Click Score: 24   End of Session Nurse Communication: Mobility status  Activity Tolerance: Patient tolerated treatment well Patient left: in bed;with call bell/phone within reach;with family/visitor present  OT Visit Diagnosis: Other symptoms and signs involving the nervous system (R29.898);Pain Pain - Right/Left: Left Pain - part of body: Arm (LBP)                Time: 6767-2094 OT Time Calculation (min): 11 min Charges:  OT General Charges $OT Visit: 1 Visit OT Evaluation $OT Eval Low Complexity: 1 Low  [facility] MS, OTR/L ascom [phone]  02/23/22, 2:22 PM

## 2022-02-23 NOTE — Progress Notes (Signed)
Anticoagulation monitoring(Lovenox):  46 yo male ordered Lovenox 40 mg Q24h    Filed Weights   02/22/22 1049  Weight: 90.7 kg (200 lb)   BMI 30.4   Lab Results  Component Value Date   CREATININE 0.78 02/22/2022   CREATININE 0.84 05/12/2021   CREATININE 0.89 09/29/2020   Estimated Creatinine Clearance: 126.1 mL/min (by C-G formula based on SCr of 0.78 mg/dL). Hemoglobin & Hematocrit     Component Value Date/Time   HGB 15.6 02/22/2022 1026   HGB 15.8 05/12/2021 1355   HCT 45.8 02/22/2022 1026   HCT 45.4 05/12/2021 1355     Per Protocol for Patient with estCrcl > 30 ml/min and BMI > 30, will transition to Lovenox 45 mg Q24h.

## 2022-02-23 NOTE — Progress Notes (Signed)
*  PRELIMINARY RESULTS* Echocardiogram 2D Echocardiogram has been performed.  [name] 02/23/2022, 10:10 AM

## 2022-02-23 NOTE — Discharge Summary (Addendum)
Physician Discharge Summary  Gary Gray:[id number] DOB: February 25, 1976 DOA: 02/22/2022  PCP: [facility]  Admit date: 02/22/2022 Discharge date: 02/23/2022  Time spent: 55 minutes  Recommendations for Outpatient Follow-up:  Follow-up with neurology for expressive aphasia.  Office will call with an appointment time. Follow-up with [facility] in 2 weeks.  On follow-up patient's blood pressure need to be reassessed as well as follow-up from the hospitalization for expressive aphasia.  Patient also needs a CBC done to follow-up on counts, as well as a basic metabolic profile done to follow-up on electrolytes and renal function. Follow-up with Dr. Marcell Barlow, neurosurgery in 2 weeks.   Discharge Diagnoses:  Principal Problem:   Expressive aphasia Active Problems:   Aphasia   Leukocytosis   Neck pain   Elevated blood pressure reading   Discharge Condition: Stable.  Diet recommendation: Heart healthy  Filed Weights   02/22/22 1049  Weight: 90.7 kg    History of present illness:  HPI per Dr. [name] Gary Gray is a 46 y.o. male with medical history significant of low back pain comes to the emergency room after he was evaluated by neurosurgery as outpatient for constant neck pain that radiates to his left shoulder and left hand for almost a week. Patient was last week December 15 seen in the emergency room was prescribed some gabapentin, steroids, baclofen, and Percocet. He was asked to follow-up with Dr. Leonie Douglas neurosurgery.   Patient was sent to the emergency room since he started experiencing difficulty speaking. He cannot put a full sentence. He knows what he has to say but cannot go get words out. Patient notice this happened after he took gabapentin.   He came to the emergency and workup so far with CT had an MRI brain negative for stroke.   It is being admitted for further evaluation and expressive aphasia.   ER physician discussed with  neurology Dr. Selina Cooley and she recommends overnight observation and further workup. Patient received aspirin 81 mg in the ER.   Hospital Course:  #1 expressive aphasia -Patient noted to have presented with difficulty speaking, could not put a full sentence together after recently being started on medication for cervical radiculopathy causing pain that radiated to his left shoulder and left arm. -Recent medications of gabapentin, steroids, baclofen, Percocet which was started per neurosurgery were held during the hospitalization and patient maintained on Tylenol as needed. -Neurology consulted on admission not recommended overnight observation for stroke workup and EEG. -Head CT done was negative for any acute abnormalities. -MRI brain with and without contrast that was negative for any acute abnormalities. -2D echo done normal EF,NWMA, no PFO noted, no source of emboli noted. -Carotid Dopplers done with no significant ICA stenosis. -EEG done was normal. -She is seen in consultation by neurology who recommended discontinuation of medications recently started for nerve pain in his arm with outpatient follow-up with neurology. -Patient cleared by neurology for discharge. -Patient be discharged in stable condition. -Outpatient follow-up with speech therapy.  2.  Neck pain/cervical spur -Medications recently started for cervical radiculopathy started by neurosurgery was held during the hospitalization as patient had presented with an expressive aphasia. -Patient maintained on Tylenol as needed. -Outpatient follow-up with neurosurgery.  3.  Elevated blood pressure -Patient on initial presentation noted to have elevated blood pressure however blood pressure improved and was 122/77 by day of discharge. -Outpatient follow-up with PCP.  4.  Leukocytosis -Patient noted to have a leukocytosis on admission with a  white count of 16.6, patient with no signs or symptoms of infection. -Patient noted to have  been on steroids prior to admission. -Patient with no respiratory or urinary symptoms.  Remained afebrile. -Leukocytosis trended down. -Outpatient follow-up with PCP.   Procedures: CT head 02/22/2022 MRI brain with and without contrast 02/22/2022 Carotid ultrasound 02/22/2022 2D echo 03/07/22 EEG 03/07/2022  Consultations: Neurology: Dr. Selina Cooley March 07, 2022  Discharge Exam: Vitals:   Mar 07, 2022 1115 03/07/2022 1616  BP:  121/77  Pulse:  70  Resp:  17  Temp: 97.9 F (36.6 C) 98 F (36.7 C)  SpO2:  98%    General: NAD.  Expressive aphasia. Cardiovascular: Regular rate rhythm no murmurs rubs or gallops.  No JVD.  No lower extremity edema. Respiratory: Lungs clear to auscultation bilaterally.  No wheezes, no crackles, no rhonchi.  Fair air movement.  Speaking in full sentences.  Discharge Instructions   Discharge Instructions     Ambulatory referral to Neurology   Complete by: As directed    Ambulatory referral to Speech Therapy   Complete by: As directed    Expressive aphasia. Request [name]   Diet - low sodium heart healthy   Complete by: As directed    Increase activity slowly   Complete by: As directed       Allergies as of 03-07-22   No Known Allergies      Medication List     STOP taking these medications    baclofen 10 MG tablet Commonly known as: LIORESAL   gabapentin 300 MG capsule Commonly known as: NEURONTIN   oxyCODONE-acetaminophen 5-325 MG tablet Commonly known as: Percocet   predniSONE 10 MG (48) Tbpk tablet Commonly known as: STERAPRED UNI-PAK 48 TAB       TAKE these medications    acetaminophen 500 MG tablet Commonly known as: TYLENOL Take 2 tablets (1,000 mg total) by mouth every 6 (six) hours as needed for mild pain (or Fever >/= 101).   meloxicam 15 MG tablet Commonly known as: MOBIC Take 0.5-1 tablets (7.5-15 mg total) by mouth daily as needed for pain.       No Known Allergies  Follow-up Information      [facility]. Schedule an appointment as soon as possible for a visit in 2 week(s).   Contact information: [address] [place] [address] [phone]         NEUROLOGY Follow up.   Why: Office will call with appointment time.        Venetia Night, MD. Schedule an appointment as soon as possible for a visit in 2 week(s).   Specialty: Neurosurgery Contact information: [address] [place] [address] [phone]                  The results of significant diagnostics from this hospitalization (including imaging, microbiology, ancillary and laboratory) are listed below for reference.    Significant Diagnostic Studies: EEG adult  Result Date: Mar 07, 2022 Jefferson Fuel, MD     07-Mar-2022  2:26 PM Routine EEG Report RAEDYN WENKE is a 46 y.o. male with a history of aphasia who is undergoing an EEG to evaluate for seizures. Report: This EEG was acquired with electrodes placed according to the International 10-20 electrode system (including Fp1, Fp2, F3, F4, C3, C4, P3, P4, O1, O2, T3, T4, T5, T6, A1, A2, Fz, Cz, Pz). The following electrodes were missing or displaced: none. The occipital dominant rhythm was 9 Hz. This activity is  reactive to stimulation. Drowsiness was manifested by background fragmentation; deeper stages of sleep were identified by K complexes and sleep spindles. There was no focal slowing. There were no interictal epileptiform discharges. There were no electrographic seizures identified. Photic stimulation and hyperventilation were not performed. Impression: This EEG was obtained while awake and asleep and is normal.   Clinical Correlation: Normal EEGs, however, do not rule out epilepsy. [name], MD [facility] [phone] If 7pm- 7am, please page neurology on call as listed in AMION.   ECHOCARDIOGRAM COMPLETE  Result Date: 02/23/2022    ECHOCARDIOGRAM REPORT   Patient Name:   VICENT FEBLES Date of Exam:  02/23/2022 Medical Rec #:  [id number]     Height:       68.0 in Accession #:    [id number]    Weight:       200.0 lb Date of Birth:  01-14-1976      BSA:          2.044 m Patient Age:    46 years      BP:           119/89 mmHg Patient Gender: M             HR:           64 bpm. Exam Location:  [facility] Procedure: 2D Echo, Color Doppler and Cardiac Doppler Indications:     G45.9 TIA  History:         Patient has no prior history of Echocardiogram examinations.                  Signs/Symptoms:Radiating neck pain & speech impairment.  Sonographer:     Humphrey Rolls Referring Phys:  [id number] SONA PATEL Diagnosing Phys: Debbe Odea MD IMPRESSIONS  1. Left ventricular ejection fraction, by estimation, is 60 to 65%. The left ventricle has normal function. The left ventricle has no regional wall motion abnormalities. Left ventricular diastolic parameters were normal.  2. Right ventricular systolic function is normal. The right ventricular size is normal.  3. The mitral valve is normal in structure. No evidence of mitral valve regurgitation.  4. The aortic valve is tricuspid. Aortic valve regurgitation is not visualized.  5. The inferior vena cava is normal in size with greater than 50% respiratory variability, suggesting right atrial pressure of 3 mmHg. FINDINGS  Left Ventricle: Left ventricular ejection fraction, by estimation, is 60 to 65%. The left ventricle has normal function. The left ventricle has no regional wall motion abnormalities. The left ventricular internal cavity size was normal in size. There is  no left ventricular hypertrophy. Left ventricular diastolic parameters were normal. Right Ventricle: The right ventricular size is normal. No increase in right ventricular wall thickness. Right ventricular systolic function is normal. Left Atrium: Left atrial size was normal in size. Right Atrium: Right atrial size was normal in size. Pericardium: There is no evidence of pericardial effusion. Mitral Valve: The mitral  valve is normal in structure. No evidence of mitral valve regurgitation. Tricuspid Valve: The tricuspid valve is normal in structure. Tricuspid valve regurgitation is mild. Aortic Valve: The aortic valve is tricuspid. Aortic valve regurgitation is not visualized. Aortic valve mean gradient measures 3.0 mmHg. Aortic valve peak gradient measures 4.6 mmHg. Aortic valve area, by VTI measures 4.03 cm. Pulmonic Valve: The pulmonic valve was not well visualized. Pulmonic valve regurgitation is not visualized. Aorta: The aortic root and ascending aorta are structurally normal, with no evidence of dilitation. Venous: The inferior  vena cava is normal in size with greater than 50% respiratory variability, suggesting right atrial pressure of 3 mmHg. IAS/Shunts: No atrial level shunt detected by color flow Doppler.  LEFT VENTRICLE PLAX 2D LVIDd:         4.20 cm   Diastology LVIDs:         2.40 cm   LV e' medial:    8.92 cm/s LV PW:         1.00 cm   LV E/e' medial:  7.3 LV IVS:        0.80 cm   LV e' lateral:   9.90 cm/s LVOT diam:     2.40 cm   LV E/e' lateral: 6.6 LV SV:         89 LV SV Index:   44 LVOT Area:     4.52 cm  RIGHT VENTRICLE RV Basal diam:  2.50 cm RV S prime:     13.20 cm/s LEFT ATRIUM             Index        RIGHT ATRIUM           Index LA diam:        3.40 cm 1.66 cm/m   RA Area:     10.70 cm LA Vol (A2C):   59.1 ml 28.92 ml/m  RA Volume:   21.80 ml  10.67 ml/m LA Vol (A4C):   36.5 ml 17.86 ml/m LA Biplane Vol: 48.2 ml 23.58 ml/m  AORTIC VALVE                    PULMONIC VALVE AV Area (Vmax):    3.98 cm     PV Vmax:       0.73 m/s AV Area (Vmean):   3.86 cm     PV Vmean:      48.600 cm/s AV Area (VTI):     4.03 cm     PV VTI:        0.146 m AV Vmax:           107.00 cm/s  PV Peak grad:  2.2 mmHg AV Vmean:          82.600 cm/s  PV Mean grad:  1.0 mmHg AV VTI:            0.221 m AV Peak Grad:      4.6 mmHg AV Mean Grad:      3.0 mmHg LVOT Vmax:         94.20 cm/s LVOT Vmean:        70.400 cm/s LVOT  VTI:          0.197 m LVOT/AV VTI ratio: 0.89  AORTA Ao Root diam: 3.80 cm MITRAL VALVE MV Area (PHT): 3.56 cm    SHUNTS MV Decel Time: 213 msec    Systemic VTI:  0.20 m MV E velocity: 65.50 cm/s  Systemic Diam: 2.40 cm MV A velocity: 68.10 cm/s MV E/A ratio:  0.96 Debbe Odea MD Electronically signed by Debbe Odea MD Signature Date/Time: 02/23/2022/12:28:52 PM    Final    US Carotid Bilateral  Result Date: 02/23/2022 CLINICAL DATA:  46 year old male with aphasia EXAM: BILATERAL CAROTID DUPLEX ULTRASOUND TECHNIQUE: Rogness Cullens scale imaging, color Doppler and duplex ultrasound were performed of bilateral carotid and vertebral arteries in the neck. COMPARISON:  None Available. FINDINGS: Criteria: Quantification of carotid stenosis is based on velocity parameters that correlate the residual internal carotid diameter with NASCET-based stenosis  levels, using the diameter of the distal internal carotid lumen as the denominator for stenosis measurement. The following velocity measurements were obtained: RIGHT ICA:  Systolic 79 cm/sec, Diastolic 37 cm/sec CCA:  78 cm/sec SYSTOLIC ICA/CCA RATIO:  1.0 ECA:  78 cm/sec LEFT ICA:  Systolic 91 cm/sec, Diastolic 31 cm/sec CCA:  65 cm/sec SYSTOLIC ICA/CCA RATIO:  1.4 ECA:  95 cm/sec Right Brachial SBP: Not acquired Left Brachial SBP: Not acquired RIGHT CAROTID ARTERY: No significant calcified disease of the right common carotid artery. Intermediate waveform maintained. Heterogeneous plaque without significant calcifications at the right carotid bifurcation. Low resistance waveform of the right ICA. No significant tortuosity. RIGHT VERTEBRAL ARTERY: Antegrade flow with low resistance waveform. LEFT CAROTID ARTERY: No significant calcified disease of the left common carotid artery. Intermediate waveform maintained. Heterogeneous plaque at the left carotid bifurcation without significant calcifications. Low resistance waveform of the left ICA. LEFT VERTEBRAL ARTERY:   Antegrade flow with low resistance waveform. IMPRESSION: Color duplex indicates minimal heterogeneous plaque, with no hemodynamically significant stenosis by duplex criteria in the extracranial cerebrovascular circulation. Signed, Yvone NeuJaime S. Miachel RouxWagner, DO, ABVM, RPVI [facility] Electronically Signed   By: Gilmer MorJaime  Wagner D.O.   On: 02/23/2022 08:41   MR BRAIN W CONTRAST  Result Date: 02/22/2022 CLINICAL DATA:  Neck pain radiating to left shoulder and hand. EXAM: MRI HEAD WITH CONTRAST TECHNIQUE: Multiplanar, multiecho pulse sequences of the brain and surrounding structures were obtained with intravenous contrast. CONTRAST:  9mL GADAVIST GADOBUTROL 1 MMOL/ML IV SOLN COMPARISON:  Same-day noncontrast brain MRI and noncontrast CT head FINDINGS: Brain: There is a 0.8 cm x 0.9 cm homogeneously enhancing extra-axial lesion overlying the right frontal lobe consistent with a meningioma. There is no mass effect on the underlying brain parenchyma. There is no other abnormal intracranial enhancement. There is no midline shift. The remainder of the brain parenchyma was evaluated on the noncontrast brain MRI obtained earlier the same day. Vascular: The major vasculature enhances normally. Skull and upper cervical spine: Unremarkable. Sinuses/Orbits: Unremarkable. Other: None. IMPRESSION: Subcentimeter right frontal meningioma. No other abnormal intracranial enhancement. Electronically Signed   By: Lesia HausenPeter  Noone M.D.   On: 02/22/2022 20:10   MR Brain Wo Contrast (neuro protocol)  Result Date: 02/22/2022 CLINICAL DATA:  Neuro deficit, acute, stroke suspected. Slurred speech. EXAM: MRI HEAD WITHOUT CONTRAST TECHNIQUE: Multiplanar, multiecho pulse sequences of the brain and surrounding structures were obtained without intravenous contrast. COMPARISON:  Head CT 02/22/2022 FINDINGS: Brain: There is no evidence of an acute infarct, intracranial hemorrhage, mass, midline  shift, or extra-axial fluid collection. The ventricles and sulci are normal. Scattered small T2 hyperintensities in the subcortical and deep cerebral white matter bilaterally are mildly advanced for age. Vascular: Small, poorly visualized distal left vertebral artery, possibly congenitally hypoplastic. Other major intracranial vascular flow voids are preserved. Skull and upper cervical spine: Unremarkable bone marrow signal. Sinuses/Orbits: Unremarkable orbits. Mucous retention cyst in the right maxillary sinus. Mild mucosal thickening in the frontal-ethmoid junction regions bilaterally. Clear mastoid air cells. Other: None. IMPRESSION: 1. No acute intracranial abnormality. 2. Mild cerebral white matter T2 signal changes, nonspecific though may reflect chronic small vessel ischemia, migraines, or prior infection/inflammation. Electronically Signed   By: Sebastian AcheAllen  Grady M.D.   On: 02/22/2022 16:33   CT Head Wo Contrast  Result Date: 02/22/2022 CLINICAL DATA:  Neuro deficit, acute, stroke suspected. Slurred speech. EXAM: CT HEAD WITHOUT CONTRAST TECHNIQUE: Contiguous axial images were obtained from the base of the skull  through the vertex without intravenous contrast. RADIATION DOSE REDUCTION: This exam was performed according to the departmental dose-optimization program which includes automated exposure control, adjustment of the mA and/or kV according to patient size and/or use of iterative reconstruction technique. COMPARISON:  Head CT 05/08/2015 FINDINGS: Brain: There is no evidence of an acute infarct, intracranial hemorrhage, mass, midline shift, or extra-axial fluid collection. The ventricles and sulci are normal. Vascular: No hyperdense vessel. Skull: No fracture or suspicious osseous lesion. Sinuses/Orbits: Visualized paranasal sinuses and mastoid air cells are clear. Visualized portions of the orbits are unremarkable. Other: None. IMPRESSION: Negative head CT. Electronically Signed   By: Sebastian Ache M.D.    On: 02/22/2022 11:03   DG Cervical Spine 2-3 Views  Result Date: 02/18/2022 CLINICAL DATA:  Bilateral shoulder pain extending into the left hand. EXAM: CERVICAL SPINE - 2-3 VIEW COMPARISON:  None Available. FINDINGS: Prevertebral soft tissues are within normal limits. Cervical spine is visualized from the skull base through the cervicothoracic junction. Vertebral body heights are maintained. Straightening of the normal cervical lordosis is present. Chronic loss of disc space is present with right greater than left uncovertebral spurring at C5-6 and left greater than right uncovertebral spurring at C6-7. This could impact the C6 and C7 nerve roots. IMPRESSION: 1. Chronic degenerative disc disease and uncovertebral spurring at C5-6 and C6-7. This could impact the C6 and C7 nerve roots. 2. No acute abnormality. Electronically Signed   By: Marin Roberts M.D.   On: 02/18/2022 13:06    Microbiology: No results found for this or any previous visit (from the past 240 hour(s)).   Labs: Basic Metabolic Panel: Recent Labs  Lab 02/22/22 1026 02/23/22 0507  NA 138  --   K 4.4  --   CL 105  --   CO2 25  --   GLUCOSE 97  --   BUN 21*  --   CREATININE 0.78 1.04  CALCIUM 9.6  --    Liver Function Tests: Recent Labs  Lab 02/22/22 1026  AST 22  ALT 26  ALKPHOS 49  BILITOT 0.8  PROT 7.5  ALBUMIN 4.6   No results for input(s): "LIPASE", "AMYLASE" in the last 168 hours. No results for input(s): "AMMONIA" in the last 168 hours. CBC: Recent Labs  Lab 02/22/22 1026 02/23/22 0507  WBC 16.6* 12.7*  NEUTROABS 12.7*  --   HGB 15.6 14.7  HCT 45.8 44.8  MCV 90.2 92.2  PLT 268 255   Cardiac Enzymes: No results for input(s): "CKTOTAL", "CKMB", "CKMBINDEX", "TROPONINI" in the last 168 hours. BNP: BNP (last 3 results) No results for input(s): "BNP" in the last 8760 hours.  ProBNP (last 3 results) No results for input(s): "PROBNP" in the last 8760 hours.  CBG: No results for  input(s): "GLUCAP" in the last 168 hours.     Signed:  Ramiro Harvest MD.  [facility] 02/23/2022, 4:23 PM

## 2022-02-23 NOTE — ED Notes (Addendum)
Pt requesting to walk around, this RN stating this is ok as long as pt stays in pod. Pt verbalized understanding. Wife with pt at this time.

## 2022-02-23 NOTE — Procedures (Signed)
Routine EEG Report  Gary Gray is a 46 y.o. male with a history of aphasia who is undergoing an EEG to evaluate for seizures.  Report: This EEG was acquired with electrodes placed according to the International 10-20 electrode system (including Fp1, Fp2, F3, F4, C3, C4, P3, P4, O1, O2, T3, T4, T5, T6, A1, A2, Fz, Cz, Pz). The following electrodes were missing or displaced: none.  The occipital dominant rhythm was 9 Hz. This activity is reactive to stimulation. Drowsiness was manifested by background fragmentation; deeper stages of sleep were identified by K complexes and sleep spindles. There was no focal slowing. There were no interictal epileptiform discharges. There were no electrographic seizures identified. Photic stimulation and hyperventilation were not performed.   Impression: This EEG was obtained while awake and asleep and is normal.    Clinical Correlation: Normal EEGs, however, do not rule out epilepsy.  [name], MD [facility] [phone]  If 7pm- 7am, please page neurology on call as listed in AMION.

## 2022-02-23 NOTE — Evaluation (Signed)
Physical Therapy Evaluation & Discharge Patient Details Name: Gary Gray MRN: [id number] DOB: [birth year] Today's Date: 02/23/2022  History of Present Illness  46 y/o male presented to ED on 02/22/22 for expressive aphasia and R arm numbness. MRI showed R frontal meningioma. PMH: back pain  Clinical Impression  Patient admitted with the above. PTA, patient lives with wife and was independent and working. Recently has been experiencing L arm numbness and tingling which imaging confirms C-spine abnormalities. Patient continues to function at independent level with no AD. Continues to demonstrate expressive aphasia. Educated patient and wife about benefit of OPPT for C-spine and back pain in attempt to reduce numbness/tingling, both in agreement to trial. Recommend OPPT at discharge. No further skilled PT needs identified acutely.        Recommendations for follow up therapy are one component of a multi-disciplinary discharge planning process, led by the attending physician.  Recommendations may be updated based on patient status, additional functional criteria and insurance authorization.  Follow Up Recommendations Outpatient PT      Assistance Recommended at Discharge PRN  Patient can return home with the following       Equipment Recommendations None recommended by PT  Recommendations for Other Services       Functional Status Assessment Patient has not had a recent decline in their functional status     Precautions / Restrictions Precautions Precautions: None Restrictions Weight Bearing Restrictions: No      Mobility  Bed Mobility                    Transfers Overall transfer level: Independent Equipment used: None                    Ambulation/Gait Ambulation/Gait assistance: Independent   Assistive device: None Gait Pattern/deviations: WFL(Within Functional Limits)       General Gait Details: ambulating within room with no  assistance  Stairs            Wheelchair Mobility    Modified Rankin (Stroke Patients Only)       Balance Overall balance assessment: Independent                                           Pertinent Vitals/Pain Pain Assessment Pain Assessment: No/denies pain    Home Living Family/patient expects to be discharged to:: Private residence Living Arrangements: Spouse/significant other Available Help at Discharge: Family Type of Home: House                  Prior Function Prior Level of Function : Independent/Modified Independent;Working/employed;Driving                     Hand Dominance        Extremity/Trunk Assessment   Upper Extremity Assessment Upper Extremity Assessment: Overall WFL for tasks assessed    Lower Extremity Assessment Lower Extremity Assessment: Overall WFL for tasks assessed    Cervical / Trunk Assessment Cervical / Trunk Assessment: Normal  Communication   Communication: Expressive difficulties  Cognition Arousal/Alertness: Awake/alert Behavior During Therapy: WFL for tasks assessed/performed Overall Cognitive Status: Within Functional Limits for tasks assessed  General Comments      Exercises     Assessment/Plan    PT Assessment Patient does not need any further PT services  PT Problem List         PT Treatment Interventions      PT Goals (Current goals can be found in the Care Plan section)  Acute Rehab PT Goals Patient Stated Goal: to go home PT Goal Formulation: All assessment and education complete, DC therapy    Frequency       Co-evaluation               AM-PAC PT "6 Clicks" Mobility  Outcome Measure Help needed turning from your back to your side while in a flat bed without using bedrails?: None Help needed moving from lying on your back to sitting on the side of a flat bed without using bedrails?: None Help  needed moving to and from a bed to a chair (including a wheelchair)?: None Help needed standing up from a chair using your arms (e.g., wheelchair or bedside chair)?: None Help needed to walk in hospital room?: None Help needed climbing 3-5 steps with a railing? : None 6 Click Score: 24    End of Session   Activity Tolerance: Patient tolerated treatment well Patient left: in bed;with family/visitor present Nurse Communication: Mobility status PT Visit Diagnosis: Other symptoms and signs involving the nervous system (R29.898)    Time: 5916-3846 PT Time Calculation (min) (ACUTE ONLY): 12 min   Charges:   PT Evaluation $PT Eval Low Complexity: 1 Low          Ocie Stanzione A. Dan Humphreys PT, DPT [facility] - Acute Rehabilitation Services   Nigel Wessman A Adaly Puder 02/23/2022, 1:23 PM

## 2022-02-24 DIAGNOSIS — M5030 Other cervical disc degeneration, unspecified cervical region: Secondary | ICD-10-CM

## 2022-02-24 DIAGNOSIS — M5412 Radiculopathy, cervical region: Secondary | ICD-10-CM

## 2022-02-24 DIAGNOSIS — M47812 Spondylosis without myelopathy or radiculopathy, cervical region: Secondary | ICD-10-CM

## 2022-02-24 NOTE — Evaluation (Signed)
Speech Language Pathology Evaluation Patient Details Name: Gary Gray MRN: [id number] DOB: 1975-07-03 Today's Date: 02/24/2022; late entry for 02/23/2022 Time: 4098-1191 SLP Time Calculation (min) (ACUTE ONLY): 15 min  Problem List:  Patient Active Problem List   Diagnosis Date Noted   Leukocytosis 02/23/2022   Neck pain 02/23/2022   Elevated blood pressure reading 02/23/2022   Expressive aphasia 02/23/2022   Aphasia 02/22/2022   Chronic back pain 07/01/2021   Shoulder pain 11/19/2020   Arthritis pain, shoulder 09/29/2020   Right shoulder pain 09/29/2020   Screen for colon cancer 09/29/2020   Back pain 09/29/2020   Past Medical History: History reviewed. No pertinent past medical history. Past Surgical History: History reviewed. No pertinent surgical history. HPI:  Gary Gray is a 46 y.o. male with medical history significant of low back pain comes to the emergency room after he was evaluated by neurosurgery as outpatient for constant neck pain that radiates to his left shoulder and left hand for almost a week. Patient was last week December 15 seen in the emergency room was prescribed some gabapentin, steroids, baclofen, and Percocet. He was asked to follow-up with Dr. Leonie Douglas neurosurgery. Patient was sent to the emergency room since he started experiencing difficulty speaking. He cannot put a full sentence. He knows what he has to say but cannot go get words out. Patient notice this happened after he took gabapentin. He came to the emergency and workup so far with CT had an MRI brain negative for stroke.   Assessment / Plan / Recommendation Clinical Impression  Pt presents with profound expressive language deficits that are c/b by anomia. As such, his expressive language primarily consists of "yes." Pt is unable to imitate basic sounds/word or recite rote information. When viewing pt's texts, it also appears that his written language has been impacted as his texts are different  from baseline. Current texts are agrammatical containing mostly nouns and verbs. However when writing with pen/pencil he is able to produce spontaneous complete complex sentences. Pt is able to read aloud sentences with 100% accuracy as well as follow written directions. Spoke with pt about using written language to help in communication. Recommend pt receive Outpatient ST services to aid in further formal diagnostic testing and treatment. Pt demonstrated understanding.    SLP Assessment  SLP Recommendation/Assessment: All further Speech Lanaguage Pathology  needs can be addressed in the next venue of care SLP Visit Diagnosis: Cognitive communication deficit (R41.841)    Recommendations for follow up therapy are one component of a multi-disciplinary discharge planning process, led by the attending physician.  Recommendations may be updated based on patient status, additional functional criteria and insurance authorization.    Follow Up Recommendations  Outpatient SLP    Assistance Recommended at Discharge     Functional Status Assessment    Frequency and Duration           SLP Evaluation Cognition  Overall Cognitive Status: No family/caregiver present to determine baseline cognitive functioning Arousal/Alertness: Awake/alert       Comprehension  Auditory Comprehension Overall Auditory Comprehension: Appears within functional limits for tasks assessed Reading Comprehension Reading Status: Within funtional limits    Expression Expression Primary Mode of Expression: Nonverbal - gestures Verbal Expression Overall Verbal Expression: Impaired Level of Generative/Spontaneous Verbalization:  (primarily "yes" and head knods) Repetition: Impaired Level of Impairment: Word level Naming: Impairment Responsive: 0-25% accurate Confrontation: Impaired Convergent: 0-24% accurate Effective Techniques:  (strategies were ineffective) Non-Verbal Means of Communication: Neurosurgeon  Expression Dominant Hand: Right Written Expression: Within Functional Limits   Oral / Motor  Oral Motor/Sensory Function Overall Oral Motor/Sensory Function: Within functional limits Motor Speech Overall Motor Speech: Impaired Respiration: Within functional limits Phonation: Normal Resonance: Within functional limits Motor Planning: Impaired Level of Impairment: Word Motor Speech Errors: Aware;Consistent Effective Techniques:  (strategies ineffective)           Nycole Kawahara B. Dreama Saa, M.S., CCC-SLP, Tree surgeon Certified Brain Injury Specialist [facility]  [facility] Rehabilitation Services Office [phone] Ascom [phone] Fax [phone]

## 2022-02-25 ENCOUNTER — Telehealth: Payer: Self-pay

## 2022-02-25 DIAGNOSIS — M47812 Spondylosis without myelopathy or radiculopathy, cervical region: Secondary | ICD-10-CM

## 2022-02-25 DIAGNOSIS — M5412 Radiculopathy, cervical region: Secondary | ICD-10-CM

## 2022-02-25 DIAGNOSIS — M5030 Other cervical disc degeneration, unspecified cervical region: Secondary | ICD-10-CM

## 2022-02-25 MED ORDER — CELECOXIB 200 MG PO CAPS: 200.0000 mg | ORAL_CAPSULE | Freq: Every day | ORAL | 0 refills | Status: DC

## 2022-02-25 NOTE — Telephone Encounter (Signed)
-----   Message from Cristin E Ray sent at 02/25/2022  9:17 AM EST ----- Was hospitalized 02/22/22 and the providers took him off these medications:  -Gabapentin 300 mg Oral 3 times daily (Stop Taking at Discharge) -oxyCODONE-Acetaminophen 5-325 MG 1 tablet Oral Every 4 hours PRN (Stop Taking at Discharge)   Patient not taking: Reported on 02/22/2022  -Baclofen 10 mg Oral 3 times daily (Stop Taking at Discharge) -predniSONE 10 MG (48) Take 6 pills for 2 days then decrease by 1 pill every 2 days (Stop Taking at Discharge)  He does have an MRI scheduled for 03/09/22. Says his pain is back to where it was before and wants to know what he can do.  Does have MyChart and is okay with MyChart message or call back @ [phone]

## 2022-02-25 NOTE — Telephone Encounter (Signed)
Do not recommend adding back any of the medications he was on prior to speech issues.   Will try celebrex to see if this gives him some relief. This was sent to his pharmacy.   Have him stop the mobic if he is still taking it. I took it off his list.

## 2022-02-27 NOTE — Consult Note (Signed)
NEUROLOGY CONSULTATION NOTE   Date of service: February 27, 2022 Patient Name: Gary Gray MRN:  [id number] DOB:  [birth year] Reason for consult: Dr. Ramiro Harvest Requesting physician: aphasia _ _ _   _ __   _ __ _ _  __ __   _ __   __ _  History of Present Illness   This is a 46 yo man with hx low back pain who presents from neurosurgery clinic where he was evaluated for neck pain radiating to his LUE for the past week. He was seen in ED on Dec 15 for same and was prescribed gabapentin, steroids, baclofen, and percocet and recommended to f/u as outpatient with neurosurgery. When he was seen in clinic today by PA he was noted to have halting telegraphic speech which was new since starting the gabapentin and was referred urgently to the ED. MRI brain wo contrast and MRI brain w contrast were remarkable only for a subcentimeter R frontal meningioma (personal review). rEEG was normal. When I saw him patient had completely intact comprehension but was unable to say more than 1-2 words at a time. SLP reviewed his text messages to his wife which were also telegraphic (noun + verb) but when he wanted to ask me questions during the exam he typed out full and appropriate sentences to do so using nouns, verbs, adjectives, and pronouns correctly.   ROS   Per HPI: all other systems reviewed and are negative  Past History   I have reviewed the following:  History reviewed. No pertinent past medical history. History reviewed. No pertinent surgical history. Family History  Problem Relation Age of Onset   Diabetes Mother    Heart disease Father    Atrial fibrillation Father    Diabetes Maternal Grandfather    Breast cancer Paternal Grandmother    Social History   Socioeconomic History   Marital status: Married    Spouse name: Not on file   Number of children: Not on file   Years of education: Not on file   Highest education level: Not on file  Occupational History   Not on file  Tobacco  Use   Smoking status: Every Day    Packs/day: 1.50    Types: Cigarettes    Passive exposure: Current   Smokeless tobacco: Never  Vaping Use   Vaping Use: Never used  Substance and Sexual Activity   Alcohol use: Yes    Alcohol/week: 3.0 standard drinks of alcohol    Types: 3 Cans of beer per week    Comment: per night   Drug use: No   Sexual activity: Yes  Other Topics Concern   Not on file  Social History Narrative   Not on file   Social Determinants of Health   Financial Resource Strain: Not on file  Food Insecurity: No Food Insecurity (02/23/2022)   Hunger Vital Sign    Worried About Running Out of Food in the Last Year: Never true    Ran Out of Food in the Last Year: Never true  Transportation Needs: No Transportation Needs (02/23/2022)   PRAPARE - Administrator, Civil Service (Medical): No    Lack of Transportation (Non-Medical): No  Physical Activity: Not on file  Stress: Not on file  Social Connections: Not on file   No Known Allergies  Medications   (Not in a hospital admission)    No current facility-administered medications for this encounter.  Current Outpatient Medications:    acetaminophen (  TYLENOL) 500 MG tablet, Take 2 tablets (1,000 mg total) by mouth every 6 (six) hours as needed for mild pain (or Fever >/= 101)., Disp: , Rfl:    celecoxib (CELEBREX) 200 MG capsule, Take 1 capsule (200 mg total) by mouth daily., Disp: 30 capsule, Rfl: 0  Vitals   Vitals:   02/23/22 0724 02/23/22 1102 02/23/22 1115 02/23/22 1616  BP:  127/83  121/77  Pulse:  73  70  Resp:  15  17  Temp: 97.7 F (36.5 C) 98 F (36.7 C) 97.9 F (36.6 C) 98 F (36.7 C)  TempSrc:  Oral Oral Oral  SpO2:  97%  98%  Weight:      Height:         Body mass index is 30.41 kg/m.  Physical Exam   Physical Exam Gen: alert, oriented but had difficulty getting his words out. Able to follow multi-step commands. HEENT: Atraumatic, normocephalic;mucous membranes moist;  oropharynx clear, tongue without atrophy or fasciculations. Neck: Supple, trachea midline. Resp: CTAB, no w/r/r CV: RRR, no m/g/r; nml S1 and S2. 2+ symmetric peripheral pulses. Abd: soft/NT/ND; nabs x 4 quad Extrem: Nml bulk; no cyanosis, clubbing, or edema.  Neuro: *MS: alert, oriented but had difficulty getting his words out. Able to follow multi-step commands. *Speech: nondysarthric, halting, word finding difficulties, unable to say more than 2 words together at a time, telegraphic *CN:    I: Deferred   II,III: PERRLA, VFF by confrontation, optic discs unable to be visualized 2/2 pupillary constriction   III,IV,VI: EOMI w/o nystagmus, no ptosis   V: Sensation intact from V1 to V3 to LT   VII: Eyelid closure was full.  Smile symmetric.   VIII: Hearing intact to voice   IX,X: Voice normal, palate elevates symmetrically    XI: SCM/trap 5/5 bilat   XII: Tongue protrudes midline, no atrophy or fasciculations   *Motor:   Normal bulk.  No tremor, rigidity or bradykinesia. No pronator drift.    Strength: Dlt Bic Tri WrE WrF FgS Gr HF KnF KnE PlF DoF    Left 5 5 5 5 5 5 5 5 5 5 5 5     Right 5 5 5 5 5 5 5 5 5 5 5 5    *Sensory: SILT. No double-simultaneous extinction.  *Coordination:  Finger-to-nose, heel-to-shin, rapid alternating motions were intact. *Reflexes:  2+ and symmetric throughout without clonus; toes down-going bilat *Gait: normal base, normal stride, normal turn. Negative Romberg.   Labs   CBC:  Recent Labs  Lab 02/22/22 1026 02/23/22 0507  WBC 16.6* 12.7*  NEUTROABS 12.7*  --   HGB 15.6 14.7  HCT 45.8 44.8  MCV 90.2 92.2  PLT 268 255    Basic Metabolic Panel:  Lab Results  Component Value Date   NA 138 02/22/2022   K 4.4 02/22/2022   CO2 25 02/22/2022   GLUCOSE 97 02/22/2022   BUN 21 (H) 02/22/2022   CREATININE 1.04 02/23/2022   CALCIUM 9.6 02/22/2022   GFRNONAA >60 02/23/2022   GFRAA >60 07/07/2014   Lipid Panel:  Lab Results  Component Value  Date   LDLCALC 133 (H) 05/12/2021   HgbA1c:  Lab Results  Component Value Date   HGBA1C 5.2 05/12/2021   Urine Drug Screen: No results found for: "LABOPIA", "COCAINSCRNUR", "LABBENZ", "AMPHETMU", "THCU", "LABBARB"  Alcohol Level No results found for: "ETH"   Impression   This is a 46 yo man with hx low back pain who presents with word-finding difficulties  after starting gabapentin. Comprehension is completely intact but spoken word is halting, telegraphic, unable to say more than 2 words together. His text messages to his wife reviewed by SLP mirrored this pattern but he is able to type and write completely normally for myself and SLP using full and complete sentences with nouns, verbs, adjectives, and pronouns correctly. Given this discrepancy my primary concern is for functional neurologic symptom disorder although medication effect cannot be excluded. His sx do not localize to the R frontal meningioma or adjacent parenchyma; this finding is v small, incidental, and asymptomatic.  Recommendations   - Stop gabapentin - He typed request for alternative nerve pain medication but I told him this would not be advisable for him to start any new medications until his sx resolved and he is seen in neurology clinic - Outpatient SLP - I will refer to outpatient neurology  Neurology to sign off, will be available prn for questions going forward. ______________________________________________________________________   Thank you for the opportunity to take part in the care of this patient. If you have any further questions, please contact the neurology consultation attending.  Signed,  [name], MD [facility] [phone]  If 7pm- 7am, please page neurology on call as listed in AMION.  **Any copied and pasted documentation in this note was written by me in another application not billed for and pasted by me into this document.

## 2022-03-09 ENCOUNTER — Ambulatory Visit
Admission: RE | Admit: 2022-03-09 | Discharge: 2022-03-09 | Disposition: A | Discharge status: Home / Self Care | Payer: Federal, State, Local not specified - PPO | Source: Ambulatory Visit | Attending: Orthopedic Surgery | Admitting: Orthopedic Surgery

## 2022-03-09 DIAGNOSIS — M4722 Other spondylosis with radiculopathy, cervical region: Secondary | ICD-10-CM | POA: Diagnosis not present

## 2022-03-09 DIAGNOSIS — M47812 Spondylosis without myelopathy or radiculopathy, cervical region: Secondary | ICD-10-CM

## 2022-03-09 DIAGNOSIS — M50123 Cervical disc disorder at C6-C7 level with radiculopathy: Secondary | ICD-10-CM | POA: Diagnosis not present

## 2022-03-09 DIAGNOSIS — M5030 Other cervical disc degeneration, unspecified cervical region: Secondary | ICD-10-CM

## 2022-03-09 DIAGNOSIS — M5412 Radiculopathy, cervical region: Secondary | ICD-10-CM

## 2022-03-11 ENCOUNTER — Ambulatory Visit (INDEPENDENT_AMBULATORY_CARE_PROVIDER_SITE_OTHER): Payer: Federal, State, Local not specified - PPO | Admitting: Orthopedic Surgery

## 2022-03-11 ENCOUNTER — Telehealth: Payer: Self-pay

## 2022-03-11 ENCOUNTER — Encounter: Payer: Self-pay | Admitting: Orthopedic Surgery

## 2022-03-11 DIAGNOSIS — M5030 Other cervical disc degeneration, unspecified cervical region: Secondary | ICD-10-CM

## 2022-03-11 DIAGNOSIS — M5412 Radiculopathy, cervical region: Secondary | ICD-10-CM

## 2022-03-11 DIAGNOSIS — M4722 Other spondylosis with radiculopathy, cervical region: Secondary | ICD-10-CM

## 2022-03-11 DIAGNOSIS — M47812 Spondylosis without myelopathy or radiculopathy, cervical region: Secondary | ICD-10-CM

## 2022-03-11 MED ORDER — TRAMADOL HCL 50 MG PO TABS: 50.0000 mg | ORAL_TABLET | Freq: Two times a day (BID) | ORAL | 0 refills | Status: DC | PRN

## 2022-03-11 NOTE — Telephone Encounter (Signed)
-----   Message from Peggyann Shoals sent at 03/11/2022  9:06 AM EST ----- Regarding: MRI results Contact: [phone] He had his MRI and he say the results are in his mychart. What is the next step?

## 2022-03-11 NOTE — Telephone Encounter (Signed)
Did phone call visit with patient this afternoon.

## 2022-03-11 NOTE — Progress Notes (Signed)
Telephone Visit- Progress Note: Referring Physician:  [facility] [address] [place],  [place] [address]  Primary Physician:  [facility]  This visit was performed via telephone.  Patient location: home Provider location: office  I spent a total of 20 minutes non-face-to-face activities for this visit on the date of this encounter including review of current clinical condition and response to treatment.    Patient has given verbal consent to this telephone visits and we reviewed the limitations of a telephone visit. Patient wishes to proceed.    Chief Complaint:  f/u MRI results  History of Present Illness: Gary Gray is a 47 y.o. male is healthy.   Last seen by me on 02/22/22 for neck and left arm pain. He has known cervical DDD/spondylosis C5-C7. He was having difficulty speaking with aphasia and was sent to ED. Admitted for work up and it was negative. He was told aphasia was likely due to neurontin.   His aphasia has resolved! He is able to talk/communicate with no issues. He continues with constant neck pain that radiates into left shoulder and into left hand with numbness in ring and small finger on left. No right arm pain.     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 He is taking celebrex with minima relief.   Exam: No exam done as this was a telephone encounter.     Imaging: MRI of cervical spine dated 03/09/22:  FINDINGS: Alignment: Straightening and slight reversal of the normal cervical lordosis. No significant listhesis.   Vertebrae: No fracture, evidence of discitis, or bone lesion. Mild asymmetric left-sided degenerative endplate marrow edema at C6-C7.   Cord: Normal signal and morphology.   Posterior Fossa, vertebral arteries, paraspinal tissues: Negative.   Disc levels:   C2-C3:  Negative disc.  Mild right facet arthropathy.  No stenosis.   C3-C4:  Tiny shallow central disc protrusion.  No stenosis.    C4-C5:  Tiny shallow central disc protrusion.  No stenosis.   C5-C6: Mild disc bulging. Moderate right and mild left uncovertebral hypertrophy. Mild spinal canal and severe right neuroforaminal stenosis. No left neuroforaminal stenosis.   C6-C7: Mild disc bulging. Severe left and mild right uncovertebral hypertrophy. Mild spinal canal and severe left neuroforaminal stenosis. No right neuroforaminal stenosis.   C7-T1: Negative disc. Mild bilateral facet arthropathy. No stenosis.   IMPRESSION: 1. Multilevel degenerative changes of the cervical spine as described above. Severe left neuroforaminal stenosis at C6-C7. 2. Severe right neuroforaminal stenosis at C5-C6.     Electronically Signed   By: Titus Dubin M.D.   On: 03/10/2022 17:21   I have personally reviewed the images and agree with the above interpretation.  Assessment and Plan: Mr. Heymann is a pleasant 47 y.o. male with constant neck pain that radiates into left shoulder and into left hand with numbness in ring and small finger on left. No right arm pain.     He has known cervical spondylosis and DDD C5-C7, mild central and severe right foraminal stenosis C5-C6, and mild central stenosis with severe left foraminal stenosis C6-C7. Pain generator is likely foraminal stenosis on left at C6-C7.   Treatment options discussed with patient and following plan made:   - Referral to PMR at the [facility] (fast track) for left C7 TF ESI.  - PT for cervical spine. Orders to [facility].  - Continue on celebrex as directed. Take with food.  - He  is miserable with pain. Will do limited prescription of ultram (reviewed with Dr. Izora Ribas and he agrees). Reviewed dosing and side effects. Take only as needed for severe pain. Can make him sleepy and/or constipated. PMP reviewed and is appropriate.  - He remains out of work (mail carrier). Note sent via MyChart to keep him out until follow up with me.  - He will f/u with me in 6 weeks and  prn.  - If no improvement, he is likely a surgical candidate. If he needs to see Dr. Izora Ribas to discuss surgery options (cervical foraminotomy versus cervical arthroplasty), he will need cervical flexion/extension xrays prior.   Geronimo Boot PA-C Neurosurgery

## 2022-03-30 DIAGNOSIS — M4802 Spinal stenosis, cervical region: Secondary | ICD-10-CM | POA: Diagnosis not present

## 2022-03-30 DIAGNOSIS — M5412 Radiculopathy, cervical region: Secondary | ICD-10-CM | POA: Diagnosis not present

## 2022-04-07 ENCOUNTER — Telehealth: Payer: Self-pay | Admitting: Orthopedic Surgery

## 2022-04-07 DIAGNOSIS — M47812 Spondylosis without myelopathy or radiculopathy, cervical region: Secondary | ICD-10-CM

## 2022-04-07 DIAGNOSIS — M5412 Radiculopathy, cervical region: Secondary | ICD-10-CM

## 2022-04-07 DIAGNOSIS — M5030 Other cervical disc degeneration, unspecified cervical region: Secondary | ICD-10-CM

## 2022-04-07 NOTE — Telephone Encounter (Addendum)
Injection can take up to 7-14 days to kick in and sometimes up to 3 weeks. He may still see some relief.   I sent new orders for PT to [facility] which is -on [address]. He can call them at [phone] to make appointment. He should be able to get in quickly.   He should keep scheduled f/u with me on 2/15.

## 2022-04-07 NOTE — Telephone Encounter (Signed)
Left detailed message on voice mail, welcome to call the office if he has any other questions.

## 2022-04-07 NOTE — Telephone Encounter (Signed)
-----  Message from Peggyann Shoals sent at 04/07/2022  8:02 AM EST ----- Regarding: pt not better Contact: [phone] Patient calling that he had an injection last Wednesday. He had no relief with the injection. His symptoms were still the same until today he felt the pain increased. He says that he reached out to [facility] PT but they never called him back so he has not started PT. He is asking what is the next step.

## 2022-04-19 DIAGNOSIS — M5412 Radiculopathy, cervical region: Secondary | ICD-10-CM | POA: Diagnosis not present

## 2022-04-20 ENCOUNTER — Telehealth: Payer: Self-pay | Admitting: Orthopedic Surgery

## 2022-04-20 NOTE — Telephone Encounter (Signed)
He has appt with me tomorrow.   He has been discharged from PT. I will have this scanned into his chart.   He needs to see Dr. Izora Ribas to discuss surgery options. Can you please schedule this at next available with him?   I am happy to see him tomorrow, but I'm not sure what I have to offer him.   Thanks.

## 2022-04-20 NOTE — Telephone Encounter (Signed)
I spoke to him yesterday and we did cancel the appt with you and he is going to see Dr.Yarbrough on 04/26/2022.

## 2022-04-21 ENCOUNTER — Inpatient Hospital Stay: Payer: Federal, State, Local not specified - PPO | Admitting: Orthopedic Surgery

## 2022-04-25 NOTE — Progress Notes (Unsigned)
Referring Physician:  No referring provider defined for this encounter.  Primary Physician:  [facility]  History of Present Illness: 04/26/2022 Mr. Gary Gray is here today with a chief complaint of left arm pain.  He gets pain into his shoulder blade, shoulder, down his left arm into his forearm and his fourth finger.  He had an injection which made his pain go away during the anesthetic phase, but it then returned.  He tried physical therapy but was discharged is a poor candidate for physical therapy.  Sitting, driving, moving his left arm make his pain worse.  It is as bad as 7 out of 10.  Nothing really helps.  Laying down makes it slightly better.  Pain in his left shoulder, down his left arm, and into left hand with numbness in ring and small finger on left    Bowel/Bladder Dysfunction: none  Conservative measures:  Physical therapy:  had his initial evaluation on 04/19/22 at [facility] and was discharged the same day.  Multimodal medical therapy including regular antiinflammatories:  tylenol, celebrex, tramadol, oxycodone, gabapentin(side effects), prednisone Injections:  has received epidural steroid injections 03/30/22: Left C6-7 TF ESI(no relief)  Past Surgery: denies  ERYN STALLINS has no symptoms of cervical myelopathy.  The symptoms are causing a significant impact on the patient's life.   I have utilized the care everywhere function in epic to review the outside records available from external health systems.  Progress Note from [facility] Boot, [place] on 03/11/22:  History of Present Illness: Gary Gray is a 47 y.o. male is healthy.    Last seen by me on 02/22/22 for neck and left arm pain. He has known cervical DDD/spondylosis C5-C7. He was having difficulty speaking with aphasia and was sent to ED. Admitted for work up and it was negative. He was told aphasia was likely due to neurontin.    His aphasia has resolved! He is able to talk/communicate with no  issues. He continues with constant neck pain that radiates into left shoulder and into left hand with numbness in ring and small finger on left. No right arm pain.      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 He is taking celebrex with minima relief.  Review of Systems:  A 10 point review of systems is negative, except for the pertinent positives and negatives detailed in the HPI.  Past Medical History: No past medical history on file.  Past Surgical History: No past surgical history on file.  Allergies: Allergies as of 04/26/2022 - Review Complete 04/26/2022  Allergen Reaction Noted   Neurontin [gabapentin]  03/11/2022    Medications: Current Meds  Medication Sig   ibuprofen (ADVIL) 200 MG tablet Take 800 mg by mouth 2 (two) times daily as needed.    Social History: Social History   Tobacco Use   Smoking status: Every Day    Packs/day: 1.50    Types: Cigarettes    Passive exposure: Current   Smokeless tobacco: Never  Vaping Use   Vaping Use: Never used  Substance Use Topics   Alcohol use: Yes    Alcohol/week: 3.0 standard drinks of alcohol    Types: 3 Cans of beer per week    Comment: per night   Drug use: No    Family Medical History: Family History  Problem Relation Age of Onset   Diabetes Mother    Heart disease Father    Atrial  fibrillation Father    Diabetes Maternal Grandfather    Breast cancer Paternal Grandmother     Physical Examination: Vitals:   04/26/22 1110  BP: (!) 145/97  Pulse: 79    General: Patient is well developed, well nourished, calm, collected, and in no apparent distress. Attention to examination is appropriate.  Neck:   Supple.  Full range of motion.  Respiratory: Patient is breathing without any difficulty.   NEUROLOGICAL:     Awake, alert, oriented to person, place, and time.  Speech is clear and fluent.   Cranial Nerves: Pupils equal round and reactive to light.  Facial tone is symmetric.   Facial sensation is symmetric. Shoulder shrug is symmetric. Tongue protrusion is midline.  There is no pronator drift.  ROM of spine: full.    Strength: Side Biceps Triceps Deltoid Interossei Grip Wrist Ext. Wrist Flex.  R 5 5 5 5 5 5 5  $ L 5 4+ 5 5 5 5 5   $ Side Iliopsoas Quads Hamstring PF DF EHL  R 5 5 5 5 5 5  $ L 5 5 5 5 5 5   $ Reflexes are 1+ and symmetric at the biceps, triceps, brachioradialis, patella and achilles.   Hoffman's is absent.   Bilateral upper and lower extremity sensation is intact to light touch.    No evidence of dysmetria noted.  Gait is normal.     Medical Decision Making  Imaging: MRI C spine 03/09/2022 IMPRESSION: 1. Multilevel degenerative changes of the cervical spine as described above. Severe left neuroforaminal stenosis at C6-C7. 2. Severe right neuroforaminal stenosis at C5-C6.     Electronically Signed   By: Titus Dubin M.D.   On: 03/10/2022 17:21  I have personally reviewed the images and agree with the above interpretation.  Assessment and Plan: Mr. Fisch is a pleasant 47 y.o. male with left C7 radiculopathy.  He tried physical therapy but was discharged due to being a poor candidate.  He did not improve after injection and medications.  He has now been having symptoms for more than 2 months.  I do not think that further conservative management is indicated as he has now been discharged from physical therapy.  I recommended C6-7 arthroplasty, as he has a disc height of 4 mm and no evidence of instability on x-rays.  He also has no kyphosis.  I discussed the planned procedure at length with the patient, including the risks, benefits, alternatives, and indications. The risks discussed include but are not limited to bleeding, infection, need for reoperation, spinal fluid leak, stroke, vision loss, anesthetic complication, coma, paralysis, and even death. We also discussed the possibility of post-operative dysphagia, vocal cord paralysis, and  the risk of adjacent segment disease in the future. I also described in detail that improvement was not guaranteed.  The patient expressed understanding of these risks, and asked that we proceed with surgery. I described the surgery in layman's terms, and gave ample opportunity for questions, which were answered to the best of my ability.  I recommended that he discontinue smoking.  I spent a total of 30 minutes in this patient's care today. This time was spent reviewing pertinent records including imaging studies, obtaining and confirming history, performing a directed evaluation, formulating and discussing my recommendations, and documenting the visit within the medical record.    Thank you for involving me in the care of this patient.      Persephanie Laatsch K. Izora Ribas MD, [facility] Neurosurgery

## 2022-04-25 NOTE — H&P (View-Only) (Signed)
Referring Physician:  No referring provider defined for this encounter.  Primary Physician:  [facility]  History of Present Illness: 04/26/2022 Mr. Gary Gray is here today with a chief complaint of left arm pain.  He gets pain into his shoulder blade, shoulder, down his left arm into his forearm and his fourth finger.  He had an injection which made his pain go away during the anesthetic phase, but it then returned.  He tried physical therapy but was discharged is a poor candidate for physical therapy.  Sitting, driving, moving his left arm make his pain worse.  It is as bad as 7 out of 10.  Nothing really helps.  Laying down makes it slightly better.  Pain in his left shoulder, down his left arm, and into left hand with numbness in ring and small finger on left    Bowel/Bladder Dysfunction: none  Conservative measures:  Physical therapy:  had his initial evaluation on 04/19/22 at [facility] and was discharged the same day.  Multimodal medical therapy including regular antiinflammatories:  tylenol, celebrex, tramadol, oxycodone, gabapentin(side effects), prednisone Injections:  has received epidural steroid injections 03/30/22: Left C6-7 TF ESI(no relief)  Past Surgery: denies  BAXTON WITTBRODT has no symptoms of cervical myelopathy.  The symptoms are causing a significant impact on the patient's life.   I have utilized the care everywhere function in epic to review the outside records available from external health systems.  Progress Note from [facility], [place] on 03/11/22:  History of Present Illness: Gary Gray is a 47 y.o. male is healthy.    Last seen by me on 02/22/22 for neck and left arm pain. He has known cervical DDD/spondylosis C5-C7. He was having difficulty speaking with aphasia and was sent to ED. Admitted for work up and it was negative. He was told aphasia was likely due to neurontin.    His aphasia has resolved! He is able to talk/communicate with no  issues. He continues with constant neck pain that radiates into left shoulder and into left hand with numbness in ring and small finger on left. No right arm pain.      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 He is taking celebrex with minima relief.  Review of Systems:  A 10 point review of systems is negative, except for the pertinent positives and negatives detailed in the HPI.  Past Medical History: No past medical history on file.  Past Surgical History: No past surgical history on file.  Allergies: Allergies as of 04/26/2022 - Review Complete 04/26/2022  Allergen Reaction Noted   Neurontin [gabapentin]  03/11/2022    Medications: Current Meds  Medication Sig   ibuprofen (ADVIL) 200 MG tablet Take 800 mg by mouth 2 (two) times daily as needed.    Social History: Social History   Tobacco Use   Smoking status: Every Day    Packs/day: 1.50    Types: Cigarettes    Passive exposure: Current   Smokeless tobacco: Never  Vaping Use   Vaping Use: Never used  Substance Use Topics   Alcohol use: Yes    Alcohol/week: 3.0 standard drinks of alcohol    Types: 3 Cans of beer per week    Comment: per night   Drug use: No    Family Medical History: Family History  Problem Relation Age of Onset   Diabetes Mother    Heart disease Father    Atrial  fibrillation Father    Diabetes Maternal Grandfather    Breast cancer Paternal Grandmother     Physical Examination: Vitals:   04/26/22 1110  BP: (!) 145/97  Pulse: 79    General: Patient is well developed, well nourished, calm, collected, and in no apparent distress. Attention to examination is appropriate.  Neck:   Supple.  Full range of motion.  Respiratory: Patient is breathing without any difficulty.   NEUROLOGICAL:     Awake, alert, oriented to person, place, and time.  Speech is clear and fluent.   Cranial Nerves: Pupils equal round and reactive to light.  Facial tone is symmetric.   Facial sensation is symmetric. Shoulder shrug is symmetric. Tongue protrusion is midline.  There is no pronator drift.  ROM of spine: full.    Strength: Side Biceps Triceps Deltoid Interossei Grip Wrist Ext. Wrist Flex.  R '5 5 5 5 5 5 5  '$ L 5 4+ '5 5 5 5 5   '$ Side Iliopsoas Quads Hamstring PF DF EHL  R '5 5 5 5 5 5  '$ L '5 5 5 5 5 5   '$ Reflexes are 1+ and symmetric at the biceps, triceps, brachioradialis, patella and achilles.   Hoffman's is absent.   Bilateral upper and lower extremity sensation is intact to light touch.    No evidence of dysmetria noted.  Gait is normal.     Medical Decision Making  Imaging: MRI C spine 03/09/2022 IMPRESSION: 1. Multilevel degenerative changes of the cervical spine as described above. Severe left neuroforaminal stenosis at C6-C7. 2. Severe right neuroforaminal stenosis at C5-C6.     Electronically Signed   By: Titus Dubin M.D.   On: 03/10/2022 17:21  I have personally reviewed the images and agree with the above interpretation.  Assessment and Plan: [name] is a pleasant 47 y.o. male with left C7 radiculopathy.  He tried physical therapy but was discharged due to being a poor candidate.  He did not improve after injection and medications.  He has now been having symptoms for more than 2 months.  I do not think that further conservative management is indicated as he has now been discharged from physical therapy.  I recommended C6-7 arthroplasty, as he has a disc height of 4 mm and no evidence of instability on x-rays.  He also has no kyphosis.  I discussed the planned procedure at length with the patient, including the risks, benefits, alternatives, and indications. The risks discussed include but are not limited to bleeding, infection, need for reoperation, spinal fluid leak, stroke, vision loss, anesthetic complication, coma, paralysis, and even death. We also discussed the possibility of post-operative dysphagia, vocal cord paralysis, and  the risk of adjacent segment disease in the future. I also described in detail that improvement was not guaranteed.  The patient expressed understanding of these risks, and asked that we proceed with surgery. I described the surgery in layman's terms, and gave ample opportunity for questions, which were answered to the best of my ability.  I recommended that he discontinue smoking.  I spent a total of 30 minutes in this patient's care today. This time was spent reviewing pertinent records including imaging studies, obtaining and confirming history, performing a directed evaluation, formulating and discussing my recommendations, and documenting the visit within the medical record.    Thank you for involving me in the care of this patient.      Nalaya Wojdyla K. Izora Ribas MD, [facility] Neurosurgery

## 2022-04-26 ENCOUNTER — Encounter: Payer: Self-pay | Admitting: Neurosurgery

## 2022-04-26 ENCOUNTER — Other Ambulatory Visit: Payer: Self-pay

## 2022-04-26 ENCOUNTER — Ambulatory Visit: Payer: Federal, State, Local not specified - PPO | Admitting: Neurosurgery

## 2022-04-26 VITALS — BP 145/97 | HR 79 | Ht 69.0 in | Wt 214.8 lb

## 2022-04-26 DIAGNOSIS — M5412 Radiculopathy, cervical region: Secondary | ICD-10-CM | POA: Diagnosis not present

## 2022-04-26 DIAGNOSIS — Z01818 Encounter for other preprocedural examination: Secondary | ICD-10-CM

## 2022-04-26 NOTE — Patient Instructions (Signed)
Please see below for information in regards to your upcoming surgery:  **we are planning to move our office location mid-March. Please check with [place] to see if we have moved to our new location prior to coming to your post op appointments after surgery. Our phone number will remain the same [phone]). Our new address will be: [facility] [address] [place], [place] [address]  Planned surgery: C6-7 arthroplasty   Surgery date: 05/11/22 - you will find out your arrival time the business day before your surgery.   Pre-op appointment at [facility]: we will call you with a date/time for this. Pre-admit testing is located on the first floor of the [facility], [place], [address]. Please bring all prescriptions in the original prescription bottles to your appointment, even if you have reviewed medications by phone with a pharmacy representative. Pre-op labs may be done at your pre-op appointment. You are not required to fast for these labs. Should you need to change your pre-op appointment, please call Pre-admit testing at [phone].    Surgical clearance: we will send a form to [facility]     Because you are having an arthroplasty: for appointments after your 2 week follow-up: please arrive at the [facility] outpatient imaging center ([place], [place]) or [facility] one hour prior to your appointment for x-rays. This applies to every appointment after your 2 week follow-up. Failure to do so may result in your appointment being rescheduled.   If you have FMLA/disability paperwork, please drop it off or fax it to [phone], attention [place].   We can be reached by phone or mychart 8am-4pm, Monday-Friday. If you have any questions/concerns before or after surgery, you can reach [place] at [phone], or you can send a mychart message. If you have a  concern after hours that cannot wait until normal business hours, you can call [phone] and ask to page the neurosurgeon on call for [place].     Appointments/FMLA & disability paperwork: [place]  Nurse: Ophelia Shoulder  Medical assistants: Lum Keas Physician Assistant's: [place] Surgeon: Meade Maw, MD

## 2022-05-02 ENCOUNTER — Encounter: Payer: Self-pay | Admitting: Family Medicine

## 2022-05-02 ENCOUNTER — Ambulatory Visit: Payer: Federal, State, Local not specified - PPO | Admitting: Family Medicine

## 2022-05-02 VITALS — BP 138/90 | HR 85 | Temp 98.4°F | Ht 69.0 in | Wt 218.4 lb

## 2022-05-02 DIAGNOSIS — Z0000 Encounter for general adult medical examination without abnormal findings: Secondary | ICD-10-CM | POA: Diagnosis not present

## 2022-05-02 DIAGNOSIS — Z23 Encounter for immunization: Secondary | ICD-10-CM | POA: Diagnosis not present

## 2022-05-02 DIAGNOSIS — M4722 Other spondylosis with radiculopathy, cervical region: Secondary | ICD-10-CM

## 2022-05-02 DIAGNOSIS — Z01818 Encounter for other preprocedural examination: Secondary | ICD-10-CM

## 2022-05-02 DIAGNOSIS — I10 Essential (primary) hypertension: Secondary | ICD-10-CM | POA: Diagnosis not present

## 2022-05-02 DIAGNOSIS — Z136 Encounter for screening for cardiovascular disorders: Secondary | ICD-10-CM | POA: Diagnosis not present

## 2022-05-02 LAB — URINALYSIS, ROUTINE W REFLEX MICROSCOPIC
Bilirubin, UA: NEGATIVE
Glucose, UA: NEGATIVE
Ketones, UA: NEGATIVE
Leukocytes,UA: NEGATIVE
Nitrite, UA: NEGATIVE
Protein,UA: NEGATIVE
RBC, UA: NEGATIVE
Specific Gravity, UA: <1.005 — ABNORMAL LOW (ref 1.005–1.030)
Urobilinogen, Ur: 0.2 mg/dL (ref 0.2–1.0)
pH, UA: 5.5 (ref 5.0–7.5)

## 2022-05-02 LAB — BAYER DCA HB A1C WAIVED: HB A1C (BAYER DCA - WAIVED): 5.6 % (ref 4.8–5.6)

## 2022-05-02 LAB — COAGUCHEK XS/INR WAIVED
INR: 1 (ref 0.9–1.1)
Prothrombin Time: 11.8 s

## 2022-05-02 LAB — MICROALBUMIN, URINE WAIVED
Creatinine, Urine Waived: 10 mg/dL (ref 10–300)
Microalb, Ur Waived: 10 mg/L (ref 0–19)
Microalb/Creat Ratio: <30 mg/g (ref <30)

## 2022-05-02 MED ORDER — LISINOPRIL 10 MG PO TABS: 10.0000 mg | ORAL_TABLET | Freq: Every day | ORAL | 2 refills | Status: DC

## 2022-05-02 NOTE — Progress Notes (Signed)
Interpreted by me today. NSR at 77bpm. No ST segment changes.

## 2022-05-02 NOTE — Progress Notes (Signed)
BP (!) 138/90   Pulse 85   Temp 98.4 F (36.9 C) (Oral)   Ht '5\' 9"'$  (1.753 m)   Wt 218 lb 6.4 oz (99.1 kg)   SpO2 97%   BMI 32.25 kg/m    Subjective:    Patient ID: Gary Gray, male    DOB: 1976-01-23, 47 y.o.   MRN: [id number]  HPI: Gary Gray is a 47 y.o. male presenting on 05/02/2022 for comprehensive medical examination. Current medical complaints include:  Here today for surgical clearance for a C6-7 arthroplasty to be done next week. He has never had any surgery in the past. He has never needed anesthesia, so he does not know if he's had any bad reactions. No history of family problems with anesthesia. No family history of issues with extubation. No family history of malignant hyperthermia. He notes that he has generally been feeling well. He has been having pain in his neck and arm with sitting and driving. He has no pain with exercise and is easily able to walk up 2 flights of stairs with out SOB, no CP. No other concerns at this time.   HYPERTENSION  Hypertension status: uncontrolled  Satisfied with current treatment? no Duration of hypertension: months BP monitoring frequency:  not checking BP medication side effects:  no Medication compliance: N/A Previous BP meds:lisinopril Aspirin: no Recurrent headaches: no Visual changes: no Palpitations: no Dyspnea: no Chest pain: no Lower extremity edema: no Dizzy/lightheaded: no  Interim Problems from his last visit: no  Depression Screen done today and results listed below:     05/02/2022    9:23 AM 05/19/2021    3:38 PM 11/17/2020    2:59 PM 09/29/2020    1:08 PM  Depression screen PHQ 2/9  Decreased Interest 0 0 0 0  Down, Depressed, Hopeless 0 0 0 0  PHQ - 2 Score 0 0 0 0  Altered sleeping 0 0 0 0  Tired, decreased energy 0 0 0 0  Change in appetite 0 0 0 0  Feeling bad or failure about yourself  0 0 0 0  Trouble concentrating 0 0 0 0  Moving slowly or fidgety/restless 0 0 0 0  Suicidal thoughts 0 0 0 0   PHQ-9 Score 0 0 0 0  Difficult doing work/chores Not difficult at all Not difficult at all     Past Medical History:  History reviewed. No pertinent past medical history.  Surgical History:  History reviewed. No pertinent surgical history.  Medications:  Current Outpatient Medications on File Prior to Visit  Medication Sig   acetaminophen (TYLENOL) 500 MG tablet Take 2 tablets (1,000 mg total) by mouth every 6 (six) hours as needed for mild pain (or Fever >/= 101).   ibuprofen (ADVIL) 200 MG tablet Take 800 mg by mouth 2 (two) times daily as needed.   No current facility-administered medications on file prior to visit.    Allergies:  Allergies  Allergen Reactions   Neurontin [Gabapentin]     aphasia    Social History:  Social History   Socioeconomic History   Marital status: Married    Spouse name: Not on file   Number of children: Not on file   Years of education: Not on file   Highest education level: Not on file  Occupational History   Not on file  Tobacco Use   Smoking status: Every Day    Packs/day: 1.50    Types: Cigarettes    Passive exposure:  Current   Smokeless tobacco: Never  Vaping Use   Vaping Use: Never used  Substance and Sexual Activity   Alcohol use: Yes    Alcohol/week: 3.0 standard drinks of alcohol    Types: 3 Cans of beer per week    Comment: per night   Drug use: No   Sexual activity: Yes  Other Topics Concern   Not on file  Social History Narrative   Not on file   Social Determinants of Health   Financial Resource Strain: Not on file  Food Insecurity: No Food Insecurity (02/23/2022)   Hunger Vital Sign    Worried About Running Out of Food in the Last Year: Never true    Ran Out of Food in the Last Year: Never true  Transportation Needs: No Transportation Needs (02/23/2022)   PRAPARE - Hydrologist (Medical): No    Lack of Transportation (Non-Medical): No  Physical Activity: Not on file  Stress: Not  on file  Social Connections: Not on file  Intimate Partner Violence: Not At Risk (02/23/2022)   Humiliation, Afraid, Rape, and Kick questionnaire    Fear of Current or Ex-Partner: No    Emotionally Abused: No    Physically Abused: No    Sexually Abused: No   Social History   Tobacco Use  Smoking Status Every Day   Packs/day: 1.50   Types: Cigarettes   Passive exposure: Current  Smokeless Tobacco Never   Social History   Substance and Sexual Activity  Alcohol Use Yes   Alcohol/week: 3.0 standard drinks of alcohol   Types: 3 Cans of beer per week   Comment: per night    Family History:  Family History  Problem Relation Age of Onset   Diabetes Mother    Heart disease Father    Atrial fibrillation Father    Diabetes Maternal Grandfather    Breast cancer Paternal Grandmother     Past medical history, surgical history, medications, allergies, family history and social history reviewed with patient today and changes made to appropriate areas of the chart.   Review of Systems  Constitutional: Negative.   HENT: Negative.    Eyes: Negative.   Respiratory: Negative.    Cardiovascular: Negative.   Gastrointestinal:  Positive for nausea (with pain). Negative for abdominal pain, blood in stool, constipation, diarrhea, heartburn, melena and vomiting.  Genitourinary: Negative.   Musculoskeletal:  Positive for back pain, myalgias and neck pain. Negative for falls and joint pain.  Skin: Negative.   Neurological:  Positive for tingling. Negative for dizziness, tremors, sensory change, speech change, focal weakness, seizures, loss of consciousness, weakness and headaches.  Endo/Heme/Allergies: Negative.   Psychiatric/Behavioral: Negative.     All other ROS negative except what is listed above and in the HPI.      Objective:    BP (!) 138/90   Pulse 85   Temp 98.4 F (36.9 C) (Oral)   Ht '5\' 9"'$  (1.753 m)   Wt 218 lb 6.4 oz (99.1 kg)   SpO2 97%   BMI 32.25 kg/m   Wt  Readings from Last 3 Encounters:  05/02/22 218 lb 6.4 oz (99.1 kg)  04/26/22 214 lb 12.8 oz (97.4 kg)  02/22/22 200 lb (90.7 kg)    Physical Exam Vitals and nursing note reviewed.  Constitutional:      General: He is not in acute distress.    Appearance: Normal appearance. He is not ill-appearing, toxic-appearing or diaphoretic.  HENT:  Head: Normocephalic and atraumatic.     Right Ear: Tympanic membrane, ear canal and external ear normal. There is no impacted cerumen.     Left Ear: Tympanic membrane, ear canal and external ear normal. There is no impacted cerumen.     Nose: Nose normal. No congestion or rhinorrhea.     Mouth/Throat:     Mouth: Mucous membranes are moist.     Pharynx: Oropharynx is clear. No oropharyngeal exudate or posterior oropharyngeal erythema.  Eyes:     General: No scleral icterus.       Right eye: No discharge.        Left eye: No discharge.     Extraocular Movements: Extraocular movements intact.     Conjunctiva/sclera: Conjunctivae normal.     Pupils: Pupils are equal, round, and reactive to light.  Neck:     Vascular: No carotid bruit.  Cardiovascular:     Rate and Rhythm: Normal rate and regular rhythm.     Pulses: Normal pulses.     Heart sounds: No murmur heard.    No friction rub. No gallop.  Pulmonary:     Effort: Pulmonary effort is normal. No respiratory distress.     Breath sounds: Normal breath sounds. No stridor. No wheezing, rhonchi or rales.  Chest:     Chest wall: No tenderness.  Abdominal:     General: Abdomen is flat. Bowel sounds are normal. There is no distension.     Palpations: Abdomen is soft. There is no mass.     Tenderness: There is no abdominal tenderness. There is no right CVA tenderness, left CVA tenderness, guarding or rebound.     Hernia: No hernia is present.  Genitourinary:    Comments: Genital exam deferred with shared decision making Musculoskeletal:        General: No swelling, tenderness, deformity or signs  of injury.     Cervical back: Normal range of motion and neck supple. No rigidity. No muscular tenderness.     Right lower leg: No edema.     Left lower leg: No edema.  Lymphadenopathy:     Cervical: No cervical adenopathy.  Skin:    General: Skin is warm and dry.     Capillary Refill: Capillary refill takes less than 2 seconds.     Coloration: Skin is not jaundiced or pale.     Findings: No bruising, erythema, lesion or rash.  Neurological:     General: No focal deficit present.     Mental Status: He is alert and oriented to person, place, and time.     Cranial Nerves: No cranial nerve deficit.     Sensory: No sensory deficit.     Motor: No weakness.     Coordination: Coordination normal.     Gait: Gait normal.     Deep Tendon Reflexes: Reflexes normal.  Psychiatric:        Mood and Affect: Mood normal.        Behavior: Behavior normal.        Thought Content: Thought content normal.        Judgment: Judgment normal.     Results for orders placed or performed during the hospital encounter of 02/22/22  Comprehensive metabolic panel  Result Value Ref Range   Sodium 138 135 - 145 mmol/L   Potassium 4.4 3.5 - 5.1 mmol/L   Chloride 105 98 - 111 mmol/L   CO2 25 22 - 32 mmol/L   Glucose, Bld 97 70 - 99  mg/dL   BUN 21 (H) 6 - 20 mg/dL   Creatinine, Ser 0.78 0.61 - 1.24 mg/dL   Calcium 9.6 8.9 - 10.3 mg/dL   Total Protein 7.5 6.5 - 8.1 g/dL   Albumin 4.6 3.5 - 5.0 g/dL   AST 22 15 - 41 U/L   ALT 26 0 - 44 U/L   Alkaline Phosphatase 49 38 - 126 U/L   Total Bilirubin 0.8 0.3 - 1.2 mg/dL   GFR, Estimated >60 >60 mL/min   Anion gap 8 5 - 15  CBC with Differential  Result Value Ref Range   WBC 16.6 (H) 4.0 - 10.5 K/uL   RBC 5.08 4.22 - 5.81 MIL/uL   Hemoglobin 15.6 13.0 - 17.0 g/dL   HCT 45.8 39.0 - 52.0 %   MCV 90.2 80.0 - 100.0 fL   MCH 30.7 26.0 - 34.0 pg   MCHC 34.1 30.0 - 36.0 g/dL   RDW 11.8 11.5 - 15.5 %   Platelets 268 150 - 400 K/uL   nRBC 0.0 0.0 - 0.2 %    Neutrophils Relative % 77 %   Neutro Abs 12.7 (H) 1.7 - 7.7 K/uL   Lymphocytes Relative 13 %   Lymphs Abs 2.2 0.7 - 4.0 K/uL   Monocytes Relative 6 %   Monocytes Absolute 1.0 0.1 - 1.0 K/uL   Eosinophils Relative 0 %   Eosinophils Absolute 0.0 0.0 - 0.5 K/uL   Basophils Relative 0 %   Basophils Absolute 0.1 0.0 - 0.1 K/uL   Immature Granulocytes 4 %   Abs Immature Granulocytes 0.60 (H) 0.00 - 0.07 K/uL  HIV Antibody (routine testing w rflx)  Result Value Ref Range   HIV Screen 4th Generation wRfx Non Reactive Non Reactive  CBC  Result Value Ref Range   WBC 12.7 (H) 4.0 - 10.5 K/uL   RBC 4.86 4.22 - 5.81 MIL/uL   Hemoglobin 14.7 13.0 - 17.0 g/dL   HCT 44.8 39.0 - 52.0 %   MCV 92.2 80.0 - 100.0 fL   MCH 30.2 26.0 - 34.0 pg   MCHC 32.8 30.0 - 36.0 g/dL   RDW 12.0 11.5 - 15.5 %   Platelets 255 150 - 400 K/uL   nRBC 0.0 0.0 - 0.2 %  Creatinine, serum  Result Value Ref Range   Creatinine, Ser 1.04 0.61 - 1.24 mg/dL   GFR, Estimated >60 >60 mL/min  ECHOCARDIOGRAM COMPLETE  Result Value Ref Range   Weight 3,200 oz   Height 68 in   BP 121/88 mmHg   Ao pk vel 1.07 m/s   AV Area VTI 4.03 cm2   AR max vel 3.98 cm2   AV Mean grad 3.0 mmHg   AV Peak grad 4.6 mmHg   S' Lateral 2.40 cm   AV Area mean vel 3.86 cm2   Area-P 1/2 3.56 cm2      Assessment & Plan:   Problem List Items Addressed This Visit       Cardiovascular and Mediastinum   HTN (hypertension)    Running borderline high for the past several months. Will get him started on low dose lisinopril and recheck 5 weeks. Should be OK for surgery.       Relevant Medications   lisinopril (ZESTRIL) 10 MG tablet   Other Relevant Orders   Microalbumin, Urine Waived     Nervous and Auditory   Osteoarthritis of spine with radiculopathy, cervical region    To have surgery next week. Continue to follow  with neurosurgery. Call with any concerns.       Other Visit Diagnoses     Routine general medical examination at a  health care facility    -  Primary   Vaccines updated. Screening labs checked today. Colon cancer screening discussed- will consider options. Continue diet and exercise. Call with any concerns.   Relevant Orders   Comprehensive metabolic panel   CBC with Differential/Platelet   Lipid Panel w/o Chol/HDL Ratio   PSA   TSH   Urinalysis, Routine w reflex microscopic   Hepatitis C Antibody   Pre-operative clearance       EKG normal. METs good. Will check labs, if OK, will clear for surgery. Await results.   Relevant Orders   EKG 12-Lead (Completed)   CoaguChek XS/INR Waived   Bayer DCA Hb A1c Waived        LABORATORY TESTING:  Health maintenance labs ordered today as discussed above.   The natural history of prostate cancer and ongoing controversy regarding screening and potential treatment outcomes of prostate cancer has been discussed with the patient. The meaning of a false positive PSA and a false negative PSA has been discussed. He indicates understanding of the limitations of this screening test and wishes to proceed with screening PSA testing.   IMMUNIZATIONS:   - Tdap: Tetanus vaccination status reviewed: Tdap vaccination indicated and given today. - Influenza: Refused - Pneumovax: Not applicable - Prevnar: Not applicable - COVID: Refused - HPV: Not applicable - Shingrix vaccine: Not applicable  SCREENING: - Colonoscopy: will get scheduled after surgery  Discussed with patient purpose of the colonoscopy is to detect colon cancer at curable precancerous or early stages   PATIENT COUNSELING:    Sexuality: Discussed sexually transmitted diseases, partner selection, use of condoms, avoidance of unintended pregnancy  and contraceptive alternatives.   Advised to avoid cigarette smoking.  I discussed with the patient that most people either abstain from alcohol or drink within safe limits (<=14/week and <=4 drinks/occasion for males, <=7/weeks and <= 3 drinks/occasion for  females) and that the risk for alcohol disorders and other health effects rises proportionally with the number of drinks per week and how often a drinker exceeds daily limits.  Discussed cessation/primary prevention of drug use and availability of treatment for abuse.   Diet: Encouraged to adjust caloric intake to maintain  or achieve ideal body weight, to reduce intake of dietary saturated fat and total fat, to limit sodium intake by avoiding high sodium foods and not adding table salt, and to maintain adequate dietary potassium and calcium preferably from fresh fruits, vegetables, and low-fat dairy products.    stressed the importance of regular exercise  Injury prevention: Discussed safety belts, safety helmets, smoke detector, smoking near bedding or upholstery.   Dental health: Discussed importance of regular tooth brushing, flossing, and dental visits.   Follow up plan: NEXT PREVENTATIVE PHYSICAL DUE IN 1 YEAR. Return in about 5 weeks (around 06/06/2022) for OK to cancel appt 3/18.

## 2022-05-02 NOTE — Assessment & Plan Note (Signed)
To have surgery next week. Continue to follow with neurosurgery. Call with any concerns.

## 2022-05-02 NOTE — Assessment & Plan Note (Signed)
Running borderline high for the past several months. Will get him started on low dose lisinopril and recheck 5 weeks. Should be OK for surgery.

## 2022-05-03 ENCOUNTER — Other Ambulatory Visit: Payer: Self-pay

## 2022-05-03 ENCOUNTER — Encounter
Admission: RE | Admit: 2022-05-03 | Discharge: 2022-05-03 | Disposition: A | Discharge status: Home / Self Care | Source: Ambulatory Visit | Attending: Neurosurgery | Admitting: Neurosurgery

## 2022-05-03 ENCOUNTER — Other Ambulatory Visit: Payer: Federal, State, Local not specified - PPO

## 2022-05-03 ENCOUNTER — Inpatient Hospital Stay: Payer: Federal, State, Local not specified - PPO | Admitting: Orthopedic Surgery

## 2022-05-03 VITALS — BP 151/109 | Resp 16 | Wt 216.7 lb

## 2022-05-03 DIAGNOSIS — Z01812 Encounter for preprocedural laboratory examination: Secondary | ICD-10-CM | POA: Insufficient documentation

## 2022-05-03 HISTORY — DX: Unspecified asthma, uncomplicated: J45.909

## 2022-05-03 HISTORY — DX: Essential (primary) hypertension: I10

## 2022-05-03 HISTORY — DX: Unspecified osteoarthritis, unspecified site: M19.90

## 2022-05-03 LAB — TYPE AND SCREEN
ABO/RH(D): O POS
Antibody Screen: NEGATIVE

## 2022-05-03 LAB — SURGICAL PCR SCREEN
MRSA, PCR: NEGATIVE
Staphylococcus aureus: NEGATIVE

## 2022-05-03 NOTE — Patient Instructions (Addendum)
Your procedure is scheduled on: 05/11/22 - Wednesday Report to the Registration Desk on the 1st floor of the [facility]. To find out your arrival time, please call [phone] between 1PM - 3PM on: 05/10/22 - Tuesday If your arrival time is 6:00 am, do not arrive before that time as the [facility] entrance doors do not open until 6:00 am.  REMEMBER: Instructions that are not followed completely may result in serious medical risk, up to and including death; or upon the discretion of your surgeon and anesthesiologist your surgery may need to be rescheduled.  Do not eat food after midnight the night before surgery.  No gum chewing or hard candies.  You may however, drink CLEAR liquids up to 2 hours before you are scheduled to arrive for your surgery. Do not drink anything within 2 hours of your scheduled arrival time.  Clear liquids include: - water  - apple juice without pulp - gatorade (not RED colors) - black coffee or tea (Do NOT add milk or creamers to the coffee or tea) Do NOT drink anything that is not on this list.  One week prior to surgery: Beginning 05/04/22 Stop Anti-inflammatories (NSAIDS) such as Advil, Aleve, Ibuprofen, Motrin, Naproxen, Naprosyn and Aspirin based products such as Excedrin, Goody's Powder, BC Powder.  Stop ANY OVER THE COUNTER supplements until after surgery.  You may however, continue to take Tylenol if needed for pain up until the day of surgery.  TAKE ONLY THESE MEDICATIONS THE MORNING OF SURGERY WITH A SIP OF WATER:  NONE  No Alcohol for 24 hours before or after surgery.  No Smoking including e-cigarettes for 24 hours before surgery.  No chewable tobacco products for at least 6 hours before surgery.  No nicotine patches on the day of surgery.  Do not use any "recreational" drugs for at least a week (preferably 2 weeks) before your surgery.  Please be advised that the combination of cocaine and anesthesia may have negative outcomes, up to  and including death. If you test positive for cocaine, your surgery will be cancelled.  On the morning of surgery brush your teeth with toothpaste and water, you may rinse your mouth with mouthwash if you wish. Do not swallow any toothpaste or mouthwash.  Use CHG Soap or wipes as directed on instruction sheet.  Do not wear jewelry, make-up, hairpins, clips or nail polish.  Do not wear lotions, powders, or perfumes.   Do not shave body hair from the neck down 48 hours before surgery.  Contact lenses, hearing aids and dentures may not be worn into surgery.  Do not bring valuables to the hospital. [facility] is not responsible for any missing/lost belongings or valuables.   Notify your doctor if there is any change in your medical condition (cold, fever, infection).  Wear comfortable clothing (specific to your surgery type) to the hospital.  After surgery, you can help prevent lung complications by doing breathing exercises.  Take deep breaths and cough every 1-2 hours. Your doctor may order a device called an Incentive Spirometer to help you take deep breaths. When coughing or sneezing, hold a pillow firmly against your incision with both hands. This is called "splinting." Doing this helps protect your incision. It also decreases belly discomfort.  If you are being admitted to the hospital overnight, leave your suitcase in the car. After surgery it may be brought to your room.  In case of increased patient census, it may be necessary for you, the patient,  to continue your postoperative care in the Same Day Surgery department.  If you are being discharged the day of surgery, you will not be allowed to drive home. You will need a responsible individual to drive you home and stay with you for 24 hours after surgery.   If you are taking public transportation, you will need to have a responsible individual with you.  Please call the [facility] Dept. at [phone] if you  have any questions about these instructions.  Surgery Visitation Policy:  Patients undergoing a surgery or procedure may have two family members or support persons with them as long as the person is not COVID-19 positive or experiencing its symptoms.   Inpatient Visitation:    Visiting hours are 7 a.m. to 8 p.m. Up to four visitors are allowed at one time in a patient room. The visitors may rotate out with other people during the day. One designated support person (adult) may remain overnight.  Due to an increase in RSV and influenza rates and associated hospitalizations, children ages 58 and under will not be able to visit patients in [facility]. Masks continue to be strongly recommended.    Preparing for Surgery with CHLORHEXIDINE GLUCONATE (CHG) Soap  Chlorhexidine Gluconate (CHG) Soap  o An antiseptic cleaner that kills germs and bonds with the skin to continue killing germs even after washing  o Used for showering the night before surgery and morning of surgery  Before surgery, you can play an important role by reducing the number of germs on your skin.  CHG (Chlorhexidine gluconate) soap is an antiseptic cleanser which kills germs and bonds with the skin to continue killing germs even after washing.  Please do not use if you have an allergy to CHG or antibacterial soaps. If your skin becomes reddened/irritated stop using the CHG.  1. Shower the NIGHT BEFORE SURGERY and the MORNING OF SURGERY with CHG soap.  2. If you choose to wash your hair, wash your hair first as usual with your normal shampoo.  3. After shampooing, rinse your hair and body thoroughly to remove the shampoo.  4. Use CHG as you would any other liquid soap. You can apply CHG directly to the skin and wash gently with a scrungie or a clean washcloth.  5. Apply the CHG soap to your body only from the neck down. Do not use on open wounds or open sores. Avoid contact with your eyes, ears, mouth, and  genitals (private parts). Wash face and genitals (private parts) with your normal soap.  6. Wash thoroughly, paying special attention to the area where your surgery will be performed.  7. Thoroughly rinse your body with warm water.  8. Do not shower/wash with your normal soap after using and rinsing off the CHG soap.  9. Pat yourself dry with a clean towel.  10. Wear clean pajamas to bed the night before surgery.  12. Place clean sheets on your bed the night of your first shower and do not sleep with pets.  13. Shower again with the CHG soap on the day of surgery prior to arriving at the hospital.  14. Do not apply any deodorants/lotions/powders.  15. Please wear clean clothes to the hospital.

## 2022-05-04 LAB — LIPID PANEL W/O CHOL/HDL RATIO
Cholesterol, Total: 215 mg/dL — ABNORMAL HIGH (ref 100–199)
HDL: 44 mg/dL (ref >39)
LDL Chol Calc (NIH): 140 mg/dL — ABNORMAL HIGH (ref 0–99)
Triglycerides: 173 mg/dL — ABNORMAL HIGH (ref 0–149)
VLDL Cholesterol Cal: 31 mg/dL (ref 5–40)

## 2022-05-04 LAB — COMPREHENSIVE METABOLIC PANEL
ALT: 30 IU/L (ref 0–44)
AST: 23 IU/L (ref 0–40)
Albumin/Globulin Ratio: 2.4 — ABNORMAL HIGH (ref 1.2–2.2)
Albumin: 5.2 g/dL — ABNORMAL HIGH (ref 4.1–5.1)
Alkaline Phosphatase: 68 IU/L (ref 44–121)
BUN/Creatinine Ratio: 9 (ref 9–20)
BUN: 8 mg/dL (ref 6–24)
Bilirubin Total: 0.3 mg/dL (ref 0.0–1.2)
CO2: 23 mmol/L (ref 20–29)
Calcium: 10.1 mg/dL (ref 8.7–10.2)
Chloride: 102 mmol/L (ref 96–106)
Creatinine, Ser: 0.86 mg/dL (ref 0.76–1.27)
Globulin, Total: 2.2 g/dL (ref 1.5–4.5)
Glucose: 78 mg/dL (ref 70–99)
Potassium: 4.5 mmol/L (ref 3.5–5.2)
Sodium: 141 mmol/L (ref 134–144)
Total Protein: 7.4 g/dL (ref 6.0–8.5)
eGFR: 108 mL/min/{1.73_m2} (ref >59)

## 2022-05-04 LAB — PSA: Prostate Specific Ag, Serum: 0.5 ng/mL (ref 0.0–4.0)

## 2022-05-04 LAB — CBC WITH DIFFERENTIAL/PLATELET
Basophils Absolute: 0.1 10*3/uL (ref 0.0–0.2)
Basos: 1 %
EOS (ABSOLUTE): 0.2 10*3/uL (ref 0.0–0.4)
Eos: 1 %
Hematocrit: 49.9 % (ref 37.5–51.0)
Hemoglobin: 16.7 g/dL (ref 13.0–17.7)
Immature Grans (Abs): 0 10*3/uL (ref 0.0–0.1)
Immature Granulocytes: 0 %
Lymphocytes Absolute: 2.1 10*3/uL (ref 0.7–3.1)
Lymphs: 15 %
MCH: 31.3 pg (ref 26.6–33.0)
MCHC: 33.5 g/dL (ref 31.5–35.7)
MCV: 93 fL (ref 79–97)
Monocytes Absolute: 0.7 10*3/uL (ref 0.1–0.9)
Monocytes: 5 %
Neutrophils Absolute: 10.6 10*3/uL — ABNORMAL HIGH (ref 1.4–7.0)
Neutrophils: 78 %
Platelets: 264 10*3/uL (ref 150–450)
RBC: 5.34 x10E6/uL (ref 4.14–5.80)
RDW: 13.1 % (ref 11.6–15.4)
WBC: 13.7 10*3/uL — ABNORMAL HIGH (ref 3.4–10.8)

## 2022-05-04 LAB — HEPATITIS C ANTIBODY: Hep C Virus Ab: NONREACTIVE

## 2022-05-04 LAB — TSH: TSH: 1.09 u[IU]/mL (ref 0.450–4.500)

## 2022-05-11 ENCOUNTER — Telehealth: Payer: Self-pay

## 2022-05-11 ENCOUNTER — Other Ambulatory Visit: Payer: Self-pay

## 2022-05-11 ENCOUNTER — Encounter
Admission: RE | Disposition: A | Discharge status: Home / Self Care | Payer: Self-pay | Source: Home / Self Care | Attending: Neurosurgery

## 2022-05-11 ENCOUNTER — Ambulatory Visit: Admitting: Certified Registered"

## 2022-05-11 ENCOUNTER — Encounter: Payer: Self-pay | Admitting: Neurosurgery

## 2022-05-11 ENCOUNTER — Ambulatory Visit

## 2022-05-11 ENCOUNTER — Ambulatory Visit
Admission: RE | Admit: 2022-05-11 | Discharge: 2022-05-11 | Disposition: A | Discharge status: Home / Self Care | Attending: Neurosurgery | Admitting: Neurosurgery

## 2022-05-11 DIAGNOSIS — G709 Myoneural disorder, unspecified: Secondary | ICD-10-CM | POA: Insufficient documentation

## 2022-05-11 DIAGNOSIS — M50323 Other cervical disc degeneration at C6-C7 level: Secondary | ICD-10-CM | POA: Insufficient documentation

## 2022-05-11 DIAGNOSIS — J45909 Unspecified asthma, uncomplicated: Secondary | ICD-10-CM | POA: Diagnosis not present

## 2022-05-11 DIAGNOSIS — M2578 Osteophyte, vertebrae: Secondary | ICD-10-CM | POA: Diagnosis not present

## 2022-05-11 DIAGNOSIS — M4722 Other spondylosis with radiculopathy, cervical region: Secondary | ICD-10-CM | POA: Insufficient documentation

## 2022-05-11 DIAGNOSIS — F17200 Nicotine dependence, unspecified, uncomplicated: Secondary | ICD-10-CM | POA: Diagnosis not present

## 2022-05-11 DIAGNOSIS — F17210 Nicotine dependence, cigarettes, uncomplicated: Secondary | ICD-10-CM | POA: Diagnosis not present

## 2022-05-11 DIAGNOSIS — M4802 Spinal stenosis, cervical region: Secondary | ICD-10-CM | POA: Insufficient documentation

## 2022-05-11 DIAGNOSIS — M5412 Radiculopathy, cervical region: Secondary | ICD-10-CM

## 2022-05-11 DIAGNOSIS — M50322 Other cervical disc degeneration at C5-C6 level: Secondary | ICD-10-CM | POA: Insufficient documentation

## 2022-05-11 DIAGNOSIS — Z01812 Encounter for preprocedural laboratory examination: Secondary | ICD-10-CM

## 2022-05-11 DIAGNOSIS — I10 Essential (primary) hypertension: Secondary | ICD-10-CM | POA: Insufficient documentation

## 2022-05-11 DIAGNOSIS — E669 Obesity, unspecified: Secondary | ICD-10-CM | POA: Diagnosis not present

## 2022-05-11 DIAGNOSIS — Z981 Arthrodesis status: Secondary | ICD-10-CM | POA: Diagnosis not present

## 2022-05-11 DIAGNOSIS — Z01818 Encounter for other preprocedural examination: Secondary | ICD-10-CM

## 2022-05-11 HISTORY — PX: CERVICAL DISC ARTHROPLASTY: SHX587

## 2022-05-11 LAB — ABO/RH: ABO/RH(D): O POS

## 2022-05-11 SURGERY — CERVICAL ANTERIOR DISC ARTHROPLASTY
Anesthesia: General | Site: Spine Cervical

## 2022-05-11 MED ORDER — OXYCODONE HCL 5 MG/5ML PO SOLN: 5.0000 mg | Freq: Once | ORAL | Status: AC | PRN

## 2022-05-11 MED ORDER — METHOCARBAMOL 500 MG PO TABS: 500.0000 mg | ORAL_TABLET | Freq: Four times a day (QID) | ORAL | 0 refills | Status: DC

## 2022-05-11 MED ORDER — MIDAZOLAM HCL 2 MG/2ML IJ SOLN
INTRAMUSCULAR | Status: AC
  Filled 2022-05-11: qty 2

## 2022-05-11 MED ORDER — FENTANYL CITRATE (PF) 100 MCG/2ML IJ SOLN
INTRAMUSCULAR | Status: AC
  Filled 2022-05-11: qty 2

## 2022-05-11 MED ORDER — ACETAMINOPHEN 10 MG/ML IV SOLN
INTRAVENOUS | Status: AC
  Filled 2022-05-11: qty 100

## 2022-05-11 MED ORDER — OXYCODONE HCL 5 MG PO CAPS: 5.0000 mg | ORAL_CAPSULE | ORAL | 0 refills | Status: AC | PRN

## 2022-05-11 MED ORDER — SODIUM CHLORIDE 0.9 % IV SOLN
INTRAVENOUS | Status: DC | PRN
  Administered 2022-05-11: .2 ug/kg/min via INTRAVENOUS

## 2022-05-11 MED ORDER — BUPIVACAINE-EPINEPHRINE (PF) 0.5% -1:200000 IJ SOLN
INTRAMUSCULAR | Status: DC | PRN
  Administered 2022-05-11: 6 mL via PERINEURAL

## 2022-05-11 MED ORDER — REMIFENTANIL HCL 1 MG IV SOLR
INTRAVENOUS | Status: AC
  Filled 2022-05-11: qty 1000

## 2022-05-11 MED ORDER — KETAMINE HCL 10 MG/ML IJ SOLN
INTRAMUSCULAR | Status: DC | PRN
  Administered 2022-05-11: 30 mg via INTRAVENOUS

## 2022-05-11 MED ORDER — PROPOFOL 10 MG/ML IV BOLUS
INTRAVENOUS | Status: DC | PRN
  Administered 2022-05-11: 30 mg via INTRAVENOUS
  Administered 2022-05-11: 170 mg via INTRAVENOUS

## 2022-05-11 MED ORDER — SUCCINYLCHOLINE CHLORIDE 200 MG/10ML IV SOSY
PREFILLED_SYRINGE | INTRAVENOUS | Status: DC | PRN
  Administered 2022-05-11: 120 mg via INTRAVENOUS

## 2022-05-11 MED ORDER — OXYCODONE HCL 5 MG PO TABS: 5.0000 mg | ORAL_TABLET | Freq: Once | ORAL | Status: AC | PRN

## 2022-05-11 MED ORDER — LIDOCAINE HCL (CARDIAC) PF 100 MG/5ML IV SOSY
PREFILLED_SYRINGE | INTRAVENOUS | Status: DC | PRN
  Administered 2022-05-11: 40 mg via INTRAVENOUS
  Administered 2022-05-11: 60 mg via INTRAVENOUS

## 2022-05-11 MED ORDER — FAMOTIDINE 20 MG PO TABS
ORAL_TABLET | ORAL | Status: AC
  Administered 2022-05-11: 20 mg via ORAL
  Filled 2022-05-11: qty 1

## 2022-05-11 MED ORDER — ACETAMINOPHEN 10 MG/ML IV SOLN
INTRAVENOUS | Status: DC | PRN
  Administered 2022-05-11: 1000 mg via INTRAVENOUS

## 2022-05-11 MED ORDER — 0.9 % SODIUM CHLORIDE (POUR BTL) OPTIME
TOPICAL | Status: DC | PRN
  Administered 2022-05-11: 500 mL

## 2022-05-11 MED ORDER — PROPOFOL 10 MG/ML IV BOLUS
INTRAVENOUS | Status: AC
  Filled 2022-05-11: qty 40

## 2022-05-11 MED ORDER — OXYCODONE HCL 5 MG PO TABS
ORAL_TABLET | ORAL | Status: AC
  Administered 2022-05-11: 5 mg via ORAL
  Filled 2022-05-11: qty 1

## 2022-05-11 MED ORDER — CEFAZOLIN SODIUM-DEXTROSE 2-4 GM/100ML-% IV SOLN
2.0000 g | Freq: Once | INTRAVENOUS | Status: AC
  Administered 2022-05-11: 2 g via INTRAVENOUS

## 2022-05-11 MED ORDER — PHENYLEPHRINE 80 MCG/ML (10ML) SYRINGE FOR IV PUSH (FOR BLOOD PRESSURE SUPPORT)
PREFILLED_SYRINGE | INTRAVENOUS | Status: AC
  Filled 2022-05-11: qty 10

## 2022-05-11 MED ORDER — FENTANYL CITRATE (PF) 100 MCG/2ML IJ SOLN: 25.0000 ug | INTRAMUSCULAR | Status: DC | PRN

## 2022-05-11 MED ORDER — SURGIFLO WITH THROMBIN (HEMOSTATIC MATRIX KIT) OPTIME
TOPICAL | Status: DC | PRN
  Administered 2022-05-11: 1

## 2022-05-11 MED ORDER — DEXMEDETOMIDINE HCL IN NACL 80 MCG/20ML IV SOLN
INTRAVENOUS | Status: DC | PRN
  Administered 2022-05-11: 12 ug via BUCCAL

## 2022-05-11 MED ORDER — CHLORHEXIDINE GLUCONATE 0.12 % MT SOLN
OROMUCOSAL | Status: AC
  Administered 2022-05-11: 15 mL via OROMUCOSAL
  Filled 2022-05-11: qty 15

## 2022-05-11 MED ORDER — PROMETHAZINE HCL 25 MG/ML IJ SOLN: 6.2500 mg | INTRAMUSCULAR | Status: DC | PRN

## 2022-05-11 MED ORDER — DROPERIDOL 2.5 MG/ML IJ SOLN: 0.6250 mg | Freq: Once | INTRAMUSCULAR | Status: DC | PRN

## 2022-05-11 MED ORDER — EPINEPHRINE PF 1 MG/ML IJ SOLN
INTRAMUSCULAR | Status: AC
  Filled 2022-05-11: qty 1

## 2022-05-11 MED ORDER — PHENYLEPHRINE 80 MCG/ML (10ML) SYRINGE FOR IV PUSH (FOR BLOOD PRESSURE SUPPORT)
PREFILLED_SYRINGE | INTRAVENOUS | Status: DC | PRN
  Administered 2022-05-11: 120 ug via INTRAVENOUS
  Administered 2022-05-11: 80 ug via INTRAVENOUS
  Administered 2022-05-11: 120 ug via INTRAVENOUS

## 2022-05-11 MED ORDER — BUPIVACAINE HCL (PF) 0.5 % IJ SOLN
INTRAMUSCULAR | Status: AC
  Filled 2022-05-11: qty 30

## 2022-05-11 MED ORDER — FENTANYL CITRATE (PF) 100 MCG/2ML IJ SOLN
INTRAMUSCULAR | Status: DC | PRN
  Administered 2022-05-11: 100 ug via INTRAVENOUS

## 2022-05-11 MED ORDER — ACETAMINOPHEN 10 MG/ML IV SOLN: 1000.0000 mg | Freq: Once | INTRAVENOUS | Status: DC | PRN

## 2022-05-11 MED ORDER — CHLORHEXIDINE GLUCONATE 0.12 % MT SOLN: 15.0000 mL | Freq: Once | OROMUCOSAL | Status: AC

## 2022-05-11 MED ORDER — KETOROLAC TROMETHAMINE 30 MG/ML IJ SOLN
INTRAMUSCULAR | Status: DC | PRN
  Administered 2022-05-11: 30 mg via INTRAVENOUS

## 2022-05-11 MED ORDER — SUCCINYLCHOLINE CHLORIDE 200 MG/10ML IV SOSY
PREFILLED_SYRINGE | INTRAVENOUS | Status: AC
  Filled 2022-05-11: qty 10

## 2022-05-11 MED ORDER — FAMOTIDINE 20 MG PO TABS: 20.0000 mg | ORAL_TABLET | Freq: Once | ORAL | Status: AC

## 2022-05-11 MED ORDER — MIDAZOLAM HCL 2 MG/2ML IJ SOLN
INTRAMUSCULAR | Status: DC | PRN
  Administered 2022-05-11: 2 mg via INTRAVENOUS

## 2022-05-11 MED ORDER — CEFAZOLIN SODIUM-DEXTROSE 2-4 GM/100ML-% IV SOLN
INTRAVENOUS | Status: AC
  Filled 2022-05-11: qty 100

## 2022-05-11 MED ORDER — LACTATED RINGERS IV SOLN: INTRAVENOUS | Status: DC

## 2022-05-11 MED ORDER — LIDOCAINE HCL (PF) 2 % IJ SOLN
INTRAMUSCULAR | Status: AC
  Filled 2022-05-11: qty 5

## 2022-05-11 MED ORDER — KETAMINE HCL 50 MG/5ML IJ SOSY
PREFILLED_SYRINGE | INTRAMUSCULAR | Status: AC
  Filled 2022-05-11: qty 5

## 2022-05-11 MED ORDER — DEXAMETHASONE SODIUM PHOSPHATE 10 MG/ML IJ SOLN
INTRAMUSCULAR | Status: DC | PRN
  Administered 2022-05-11: 10 mg via INTRAVENOUS

## 2022-05-11 MED ORDER — EPHEDRINE SULFATE (PRESSORS) 50 MG/ML IJ SOLN
INTRAMUSCULAR | Status: DC | PRN
  Administered 2022-05-11: 10 mg via INTRAVENOUS

## 2022-05-11 MED ORDER — ONDANSETRON HCL 4 MG/2ML IJ SOLN
INTRAMUSCULAR | Status: DC | PRN
  Administered 2022-05-11: 4 mg via INTRAVENOUS

## 2022-05-11 MED ORDER — PHENYLEPHRINE HCL-NACL 20-0.9 MG/250ML-% IV SOLN
INTRAVENOUS | Status: DC | PRN
  Administered 2022-05-11: 20 ug/min via INTRAVENOUS

## 2022-05-11 MED ORDER — NAPROXEN 500 MG PO TABS: 500.0000 mg | ORAL_TABLET | Freq: Two times a day (BID) | ORAL | 0 refills | Status: DC

## 2022-05-11 MED ORDER — SENNA 8.6 MG PO TABS: 1.0000 | ORAL_TABLET | Freq: Every day | ORAL | 0 refills | Status: DC | PRN

## 2022-05-11 MED ORDER — EPHEDRINE 5 MG/ML INJ
INTRAVENOUS | Status: AC
  Filled 2022-05-11: qty 5

## 2022-05-11 MED ORDER — ORAL CARE MOUTH RINSE: 15.0000 mL | Freq: Once | OROMUCOSAL | Status: AC

## 2022-05-11 SURGICAL SUPPLY — 57 items
ADH SKN CLS APL DERMABOND .7 (GAUZE/BANDAGES/DRESSINGS) ×1
AGENT HMST KT MTR STRL THRMB (HEMOSTASIS) ×1
APL PRP STRL LF DISP 70% ISPRP (MISCELLANEOUS) ×2
BUR NEURO DRILL SOFT 3.0X3.8M (BURR) ×2 IMPLANT
CHLORAPREP W/TINT 26 (MISCELLANEOUS) ×4 IMPLANT
COUNTER NEEDLE 20/40 LG (NEEDLE) ×2 IMPLANT
DERMABOND ADVANCED .7 DNX12 (GAUZE/BANDAGES/DRESSINGS) ×2 IMPLANT
DISC SIMPLIFY SZ 2.5 (Miscellaneous) IMPLANT
DRAPE C ARM PK CFD 31 SPINE (DRAPES) ×2 IMPLANT
DRAPE C-ARM XRAY 36X54 (DRAPES) IMPLANT
DRAPE LAPAROTOMY 77X122 PED (DRAPES) ×2 IMPLANT
DRAPE MICROSCOPE SPINE 48X150 (DRAPES) ×2 IMPLANT
DRAPE SURG 17X11 SM STRL (DRAPES) ×2 IMPLANT
DRSG OPSITE POSTOP 4X6 (GAUZE/BANDAGES/DRESSINGS) ×4 IMPLANT
ELECT CAUTERY BLADE TIP 2.5 (TIP) ×1 IMPLANT
ELECT REM PT RETURN 9FT ADLT (ELECTROSURGICAL) ×1 IMPLANT
ELECTRODE CAUTERY BLDE TIP 2.5 (TIP) ×2 IMPLANT
ELECTRODE REM PT RTRN 9FT ADLT (ELECTROSURGICAL) ×2 IMPLANT
FEE INTRAOP CADWELL SUPPLY NCS (MISCELLANEOUS) IMPLANT
FEE INTRAOP MONITOR IMPULS NCS (MISCELLANEOUS) IMPLANT
GLOVE BIOGEL PI IND STRL 8.5 (GLOVE) ×2 IMPLANT
GLOVE SURG SYN 6.5 ES PF (GLOVE) ×1 IMPLANT
GLOVE SURG SYN 6.5 PF PI (GLOVE) ×2 IMPLANT
GLOVE SURG SYN 8.5  E (GLOVE) ×3
GLOVE SURG SYN 8.5 E (GLOVE) ×3 IMPLANT
GLOVE SURG SYN 8.5 PF PI (GLOVE) ×6 IMPLANT
GLOVE SURG UNDER POLY LF SZ6.5 (GLOVE) ×2 IMPLANT
GLOVE SURG UNDER POLY LF SZ8.5 (GLOVE) ×2 IMPLANT
GOWN SRG LRG LVL 4 IMPRV REINF (GOWNS) ×2 IMPLANT
GOWN SRG XL LVL 3 NONREINFORCE (GOWNS) ×2 IMPLANT
GOWN STRL NON-REIN TWL XL LVL3 (GOWNS) ×1
GOWN STRL REIN LRG LVL4 (GOWNS) ×1
GRADUATE 1200CC STRL 31836 (MISCELLANEOUS) ×2 IMPLANT
INTRAOP CADWELL SUPPLY FEE NCS (MISCELLANEOUS) IMPLANT
INTRAOP DISP SUPPLY FEE NCS (MISCELLANEOUS)
INTRAOP MONITOR FEE IMPULS NCS (MISCELLANEOUS) IMPLANT
INTRAOP MONITOR FEE IMPULSE (MISCELLANEOUS)
KIT TURNOVER KIT A (KITS) ×2 IMPLANT
MANIFOLD NEPTUNE II (INSTRUMENTS) ×2 IMPLANT
MARKER SKIN DUAL TIP RULER LAB (MISCELLANEOUS) ×4 IMPLANT
NDL SAFETY ECLIP 18X1.5 (MISCELLANEOUS) IMPLANT
NS IRRIG 1000ML POUR BTL (IV SOLUTION) ×2 IMPLANT
PACK LAMINECTOMY NEURO (CUSTOM PROCEDURE TRAY) ×2 IMPLANT
PAD ARMBOARD 7.5X6 YLW CONV (MISCELLANEOUS) ×2 IMPLANT
PIN DISTRACTION 14MM (PIN) IMPLANT
SOLUTION IRRIG SURGIPHOR (IV SOLUTION) ×2 IMPLANT
SPONGE KITTNER 5P (MISCELLANEOUS) ×2 IMPLANT
STAPLER SKIN PROX 35W (STAPLE) IMPLANT
SURGIFLO W/THROMBIN 8M KIT (HEMOSTASIS) ×2 IMPLANT
SUT DVC VLOC 3-0 CL 6 P-12 (SUTURE) IMPLANT
SUT VIC AB 3-0 SH 8-18 (SUTURE) ×2 IMPLANT
SYR 30ML LL (SYRINGE) ×2 IMPLANT
TAPE CLOTH 3X10 WHT NS LF (GAUZE/BANDAGES/DRESSINGS) ×2 IMPLANT
TOWEL OR 17X26 4PK STRL BLUE (TOWEL DISPOSABLE) ×6 IMPLANT
TRAP FLUID SMOKE EVACUATOR (MISCELLANEOUS) ×2 IMPLANT
TRAY FOLEY MTR SLVR 16FR STAT (SET/KITS/TRAYS/PACK) IMPLANT
TUBING CONNECTING 10 (TUBING) ×2 IMPLANT

## 2022-05-11 NOTE — Interval H&P Note (Signed)
History and Physical Interval Note:  05/11/2022 8:14 AM  Gary Gray  has presented today for surgery, with the diagnosis of M54.12 cervical radiculopathy.  The various methods of treatment have been discussed with the patient and family. After consideration of risks, benefits and other options for treatment, the patient has consented to  Procedure(s): C6-7 ARTHROPLASTY (N/A) as a surgical intervention.  The patient's history has been reviewed, patient examined, no change in status, stable for surgery.  I have reviewed the patient's chart and labs.  Questions were answered to the patient's satisfaction.    Heart sounds normal no MRG. Chest Clear to Auscultation Bilaterally.   [name]

## 2022-05-11 NOTE — Anesthesia Preprocedure Evaluation (Signed)
Anesthesia Evaluation  Patient identified by MRN, date of birth, ID band Patient awake    Reviewed: Allergy & Precautions, H&P , NPO status , Patient's Chart, lab work & pertinent test results, reviewed documented beta blocker date and time   Airway Mallampati: II  TM Distance: >3 FB Neck ROM: full    Dental  (+) Teeth Intact   Pulmonary asthma , Current Smoker and Patient abstained from smoking.   Pulmonary exam normal        Cardiovascular Exercise Tolerance: Good hypertension, On Medications negative cardio ROS Normal cardiovascular exam Rhythm:regular Rate:Normal     Neuro/Psych  Neuromuscular disease  negative psych ROS   GI/Hepatic negative GI ROS, Neg liver ROS,,,  Endo/Other  negative endocrine ROS    Renal/GU negative Renal ROS  negative genitourinary   Musculoskeletal   Abdominal   Peds  Hematology negative hematology ROS (+)   Anesthesia Other Findings Past Medical History: No date: Arthritis No date: Asthma No date: Hypertension History reviewed. No pertinent surgical history. BMI    Body Mass Index: 31.01 kg/m     Reproductive/Obstetrics negative OB ROS                             Anesthesia Physical Anesthesia Plan  ASA: 2  Anesthesia Plan: General ETT   Post-op Pain Management:    Induction:   PONV Risk Score and Plan:   Airway Management Planned:   Additional Equipment:   Intra-op Plan:   Post-operative Plan:   Informed Consent: I have reviewed the patients History and Physical, chart, labs and discussed the procedure including the risks, benefits and alternatives for the proposed anesthesia with the patient or authorized representative who has indicated his/her understanding and acceptance.     Dental Advisory Given  Plan Discussed with: CRNA  Anesthesia Plan Comments:        Anesthesia Quick Evaluation

## 2022-05-11 NOTE — Telephone Encounter (Signed)
Received request from [employer] that pt's oxycodone requires prior authorization. [facility]  Request submitted. I notified Mr Goodgion that Suzie Portela said the cash price is $16.50 if he chooses to pay cash instead of waiting for the approval.   *Initial rx was erroneously sent for capsules instead of tablets. I spoke with the pharmacist and they are going to switch it to tablets.

## 2022-05-11 NOTE — Transfer of Care (Signed)
Immediate Anesthesia Transfer of Care Note  Patient: Gary Gray  Procedure(s) Performed: C6-7 ARTHROPLASTY (Spine Cervical)  Patient Location: PACU  Anesthesia Type:General  Level of Consciousness: drowsy  Airway & Oxygen Therapy: Patient Spontanous Breathing and Patient connected to face mask oxygen  Post-op Assessment: Report given to RN, Post -op Vital signs reviewed and stable, and Patient moving all extremities  Post vital signs: Reviewed and stable  Last Vitals:  Vitals Value Taken Time  BP 120/84 05/11/22 1025  Temp 36.3 C 05/11/22 1025  Pulse 74 05/11/22 1029  Resp 10 05/11/22 1029  SpO2 100 % 05/11/22 1029  Vitals shown include unvalidated device data.  Last Pain:  Vitals:   05/11/22 0734  TempSrc: Tympanic  PainSc: 2       Patients Stated Pain Goal: 0 ([id number])  Complications: No notable events documented.

## 2022-05-11 NOTE — Anesthesia Procedure Notes (Signed)
Procedure Name: Intubation Date/Time: 05/11/2022 8:29 AM  Performed by: Esaw Grandchild, CRNAPre-anesthesia Checklist: Patient identified, Emergency Drugs available, Suction available and Patient being monitored Patient Re-evaluated:Patient Re-evaluated prior to induction Oxygen Delivery Method: Circle system utilized Preoxygenation: Pre-oxygenation with 100% oxygen Induction Type: IV induction Ventilation: Mask ventilation without difficulty Laryngoscope Size: McGraph and 3 Grade View: Grade I Tube type: Oral Tube size: 7.5 mm Number of attempts: 1 Airway Equipment and Method: Stylet, Oral airway, Bite block and LTA kit utilized Placement Confirmation: ETT inserted through vocal cords under direct vision, positive ETCO2 and breath sounds checked- equal and bilateral Secured at: 22 cm Tube secured with: Tape Dental Injury: Teeth and Oropharynx as per pre-operative assessment

## 2022-05-11 NOTE — Telephone Encounter (Signed)
Request has been approved through 11/07/22. Faxed approval to [place]. Notified Mr Stablein of approval.

## 2022-05-11 NOTE — Op Note (Signed)
Indications: Mr. Ginnetti is a 47 yo male who presented with cervical radiculopathy.  He failed conservative management promtping surgical intervention.  Findings: compression of L C7 nerve root  Preoperative Diagnosis: cervical radiculopathy Postoperative Diagnosis: same   EBL: 15 ml IVF: see AR ml Drains: none Disposition: Extubated and Stable to PACU Complications: none  No foley catheter was placed.   Preoperative Note:   Risks of surgery discussed include: infection, bleeding, stroke, coma, death, paralysis, CSF leak, nerve/spinal cord injury, numbness, tingling, weakness, complex regional pain syndrome, recurrent stenosis and/or disc herniation, vascular injury, development of instability, neck/back pain, need for further surgery, persistent symptoms, development of deformity, and the risks of anesthesia. The patient understood these risks and agreed to proceed.  Operative Note:  Procedure:  1) Cervical Disc Arthroplasty at C6/7 using a Nuvasive Simplify disc   Procedure: After obtaining informed consent, the patient taken to the operating room, placed in supine position, general anesthesia induced.  The patient had a small shoulder roll placed behind the neck.  The patient received preop antibiotics and IV Decadron.  The patient had a neck incision outlined, was prepped and draped in usual sterile fashion. The incision was injected with local anesthetic.   An incision was opened, dissection taken down medial to the carotid artery and jugular vein, lateral to the trachea and esophagus.  The prevertebral fascia identified and a localizing x-ray demonstrated the correct level.  The longus colli were dissected laterally, and self-retaining retractors placed to open the operative field. The microscope was then brought into the field.  With this complete, distractor pins were placed in the vertebral bodies of C6 and C7. The distractor was placed, and the anulus at C6/7 was opened using a  bovie.  Curettes and pituitary rongeurs used to remove the majority of disk, then the drill was used to remove the posterior osteophyte and begin the foraminotomies. The nerve hook was used to elevate the posterior longitudinal ligament, which was then removed with Kerrison rongeurs. The microblunt nerve hook could be passed out the foramen bilaterally.   Meticulous hemostasis was obtained.  A trial spacer was used to size the disc space, then a keel was created for the disc. Using flouroscopic guidance, a 16 mm width x 14 mm depth x 5 mm height Nuvasive Simplify was then inserted in the prepared disc space.  The caspar distractor was removed, and bone wax used for hemostasis. Final AP and lateral radiographs were taken.   With the disc arthroplasty in good position, the wound was irrigated copiously and meticulous hemostasis obtained.  Wound was closed in 2 layers using interrupted inverted 3-0 Vicryl sutures.  The wound was dressed with dermabond, the head of bed at 30 degrees, taken to recovery room in stable condition.  No new postop neurological deficits were identified.  Sponge and pattie counts were correct at the end of the procedure.     I performed the entire procedure with the assistance of Cooper Render PA as an Pensions consultant. An assistant was required for this procedure due to the complexity.  The assistant provided assistance in tissue manipulation and suction, and was required for the successful and safe performance of the procedure. I performed the critical portions of the procedure.   Meade Maw MD

## 2022-05-11 NOTE — Discharge Summary (Signed)
Discharge Summary  Patient ID: Gary Gray MRN: [id number] DOB/AGE: [birth year] 47 y.o.  Admit date: 05/11/2022 Discharge date: 05/11/2022  Admission Diagnoses: Cervical radiculopathy   Discharge Diagnoses:  Active Problems:   Cervical radiculitis   Discharged Condition: good  Hospital Course:  Gary Gray is a 47 y.o presenting with left sided cervical radiculopathy s/p C6-7 arthroplasty. His intraoperative course was uncomplicated and he was monitored in PACU for 4 hours post-op. He was discharged home after ambulating, urinating, and tolerating PO intake.   Consults: None  Significant Diagnostic Studies: none  Treatments: surgery: as above. Please see separately dictated operative report for further detail   Discharge Exam: Blood pressure (!) 141/101, pulse 63, temperature (!) 97.4 F (36.3 C), temperature source Temporal, resp. rate 16, height '5\' 9"'$  (1.753 m), weight 95.3 kg, SpO2 97 %. CN II-XII grossly intact 5/5 throughout BUE Incision c/d/I with dermabond in place  Disposition: Discharge disposition: 01-Home or Self Care        Allergies as of 05/11/2022       Reactions   Neurontin [gabapentin]    aphasia        Medication List     STOP taking these medications    ibuprofen 200 MG tablet Commonly known as: ADVIL       TAKE these medications    acetaminophen 500 MG tablet Commonly known as: TYLENOL Take 2 tablets (1,000 mg total) by mouth every 6 (six) hours as needed for mild pain (or Fever >/= 101).   lisinopril 10 MG tablet Commonly known as: ZESTRIL Take 1 tablet (10 mg total) by mouth daily.   methocarbamol 500 MG tablet Commonly known as: ROBAXIN Take 1 tablet (500 mg total) by mouth 4 (four) times daily.   naproxen 500 MG tablet Commonly known as: Naprosyn Take 1 tablet (500 mg total) by mouth 2 (two) times daily with a meal.   omeprazole 20 MG capsule Commonly known as: PRILOSEC Take 20 mg by mouth as needed.   oxycodone 5 MG  capsule Commonly known as: OXY-IR Take 1 capsule (5 mg total) by mouth every 4 (four) hours as needed for up to 5 days.   senna 8.6 MG Tabs tablet Commonly known as: SENOKOT Take 1 tablet (8.6 mg total) by mouth daily as needed for mild constipation.        Follow-up Information     Loleta Dicker, PA Follow up on 05/24/2022.   Specialty: Neurosurgery Why: at 2:30pm for post operative follow up Contact information: [address] [place] [address] [phone]                 Signed: Loleta Dicker 05/11/2022, 3:06 PM

## 2022-05-11 NOTE — Discharge Instructions (Addendum)
Your surgeon has performed an operation on your cervical spine (neck) to relieve pressure on the spinal cord and/or nerves. This involved making an incision in the front of your neck and removing one or more of the discs that support your spine. Next, a small piece of bone, a titanium plate, and screws were used to fuse two or more of the vertebrae (bones) together.  The following are instructions to help in your recovery once you have been discharged from the hospital. Even if you feel well, it is important that you follow these activity guidelines. If you do not let your neck heal properly from the surgery, you can increase the chance of return of your symptoms and other complications.  Activity    No bending, lifting, or twisting ("BLT"). Avoid lifting objects heavier than 10 pounds (gallon milk jug).  Where possible, avoid household activities that involve lifting, bending, reaching, pushing, or pulling such as laundry, vacuuming, grocery shopping, and childcare. Try to arrange for help from friends and family for these activities while your back heals.  Increase physical activity slowly as tolerated.  Taking short walks is encouraged, but avoid strenuous exercise. Do not jog, run, bicycle, lift weights, or participate in any other exercises unless specifically allowed by your doctor.  Talk to your doctor before resuming sexual activity.  You should not drive until cleared by your doctor.  Until released by your doctor, you should not return to work or school.  You should rest at home and let your body heal.   You may shower three days after your surgery.  After showering, lightly dab your incision dry. Do not take a tub bath or go swimming until approved by your doctor at your follow-up appointment.  If your doctor ordered a cervical collar (neck brace) for you, you should wear it whenever you are out of bed. You may remove it when lying down or sleeping, but you should wear it at all other  times. Not all neck surgeries require a cervical collar.  If you smoke, we strongly recommend that you quit.  Smoking has been proven to interfere with normal bone healing and will dramatically reduce the success rate of your surgery. Please contact [facility] (800-QUIT-NOW) and use the resources at www.[facility].com for assistance in stopping smoking.  Surgical Incision   If you have a dressing on your incision, you may remove it two days after your surgery. Keep your incision area clean and dry.  If you have staples or stitches on your incision, you should have a follow up scheduled for removal. If you do not have staples or stitches, you will have steri-strips (small pieces of surgical tape) or Dermabond glue. The steri-strips/glue should begin to peel away within about a week (it is fine if the steri-strips fall off before then). If the strips are still in place one week after your surgery, you may gently remove them.  Diet           You may return to your usual diet. However, you may experience discomfort when swallowing in the first month after your surgery. This is normal. You may find that softer foods are more comfortable for you to swallow. Be sure to stay hydrated.  When to Contact us  You may experience pain in your neck and/or pain between your shoulder blades. This is normal and should improve in the next few weeks with the help of pain medication, muscle relaxers, and rest. Some patients report that a warm compress  on the back of the neck or between the shoulder blades helps.  However, should you experience any of the following, contact us immediately: New numbness or weakness Pain that is progressively getting worse, and is not relieved by your pain medication, muscle relaxers, rest, and warm compresses Bleeding, redness, swelling, pain, or drainage from surgical incision Chills or flu-like symptoms Fever greater than 101.0 F (38.3 C) Inability to eat, drink fluids, or take  medications Problems with bowel or bladder functions Difficulty breathing or shortness of breath Warmth, tenderness, or swelling in your calf Contact Information During office hours (Monday-Friday 9 am to 5 pm), please call your physician at [phone] and ask for Berdine Addison After hours and weekends, please call [phone] and speak with the neurosurgeon on call For a life-threatening emergency, call [place]   The drugs that you were given will stay in your system until tomorrow so for the next 24 hours you should not:  Drive an automobile Make any legal decisions Drink any alcoholic beverage   You may resume regular meals tomorrow.  Today it is better to start with liquids and gradually work up to solid foods.  You may eat anything you prefer, but it is better to start with liquids, then soup and crackers, and gradually work up to solid foods.   Please notify your doctor immediately if you have any unusual bleeding, trouble breathing, redness and pain at the surgery site, drainage, fever, or pain not relieved by medication.    Additional Instructions:        Please contact your physician with any problems or Same Day Surgery at [phone], Monday through Friday 6 am to 4 pm, or [place] at [facility] number at [phone].

## 2022-05-12 ENCOUNTER — Encounter: Payer: Self-pay | Admitting: Neurosurgery

## 2022-05-12 NOTE — Anesthesia Postprocedure Evaluation (Signed)
Anesthesia Post Note  Patient: Gary Gray  Procedure(s) Performed: C6-7 ARTHROPLASTY (Spine Cervical)  Patient location during evaluation: PACU Anesthesia Type: General Level of consciousness: awake and alert Pain management: pain level controlled Vital Signs Assessment: post-procedure vital signs reviewed and stable Respiratory status: spontaneous breathing, nonlabored ventilation, respiratory function stable and patient connected to nasal cannula oxygen Cardiovascular status: blood pressure returned to baseline and stable Postop Assessment: no apparent nausea or vomiting Anesthetic complications: no   No notable events documented.   Last Vitals:  Vitals:   05/11/22 1300 05/11/22 1419  BP: (!) 159/103 (!) 141/101  Pulse: 74 63  Resp: 14 16  Temp:  (!) 36.3 C  SpO2: 99% 97%    Last Pain:  Vitals:   05/11/22 1419  TempSrc: Temporal  PainSc:                  Molli Barrows

## 2022-05-23 ENCOUNTER — Encounter: Payer: Federal, State, Local not specified - PPO | Admitting: Family Medicine

## 2022-05-24 ENCOUNTER — Ambulatory Visit (INDEPENDENT_AMBULATORY_CARE_PROVIDER_SITE_OTHER): Payer: Worker's Compensation | Admitting: Neurosurgery

## 2022-05-24 ENCOUNTER — Encounter: Payer: Self-pay | Admitting: Neurosurgery

## 2022-05-24 VITALS — BP 140/90 | HR 92 | Temp 98.6°F | Ht 69.0 in | Wt 210.0 lb

## 2022-05-24 DIAGNOSIS — M4722 Other spondylosis with radiculopathy, cervical region: Secondary | ICD-10-CM

## 2022-05-24 NOTE — Progress Notes (Signed)
   REFERRING PHYSICIAN:  [facility], [facility] [place],  [place] [address]  DOS: 05/11/22 C6-7 arthroplasty  HISTORY OF PRESENT ILLNESS: Gary Gray is about 2 weeks status post cervical arthroplasty. Overall, he is doing well postoperatively. He reports completed resolution of his pre-op arm pain. He has had about 2 days of increased shoulder blade pain but has been more active over the last couple of days.  He is no longer taking narcotic pain medication.  PHYSICAL EXAMINATION:  NEUROLOGICAL:  General: In no acute distress.   Awake, alert, oriented to person, place, and time.  Pupils equal round and reactive to light.  Facial tone is symmetric.  Strength: Side Biceps Triceps Deltoid Interossei Grip Wrist Ext. Wrist Flex.  R 5 5 5 5 5 5 5   L 5 5 5 5 5 5 5    Incision c/d/I and healing well  Imaging:  No interval imaging to review  Assessment / Plan: Gary Gray is doing 2 weeks after cervical arthroplasty.  I encouraged him to take a muscle relaxer at least at night to help with the pain between his shoulder blades as well as naproxen as he has not been taking this.  Should his symptoms persist, we will consider getting an x-ray.  We discussed activity escalation and I have advised the patient to lift up to 10 pounds until 6 weeks after surgery, then increase up to 25 pounds until 12 weeks after surgery.  After 12 weeks post-op, the patient advised to increase activity as tolerated. he will return to clinic to see Dr. Izora Ribas in approximately 4 weeks with cervical x-rays prior. Advised to contact the office if any questions or concerns arise.   Cooper Render PA-C Dept of Neurosurgery

## 2022-06-06 ENCOUNTER — Ambulatory Visit: Payer: Federal, State, Local not specified - PPO | Admitting: Family Medicine

## 2022-06-06 ENCOUNTER — Encounter: Payer: Self-pay | Admitting: Family Medicine

## 2022-06-06 VITALS — BP 132/82 | HR 82 | Temp 98.6°F | Ht 69.0 in | Wt 216.3 lb

## 2022-06-06 DIAGNOSIS — I10 Essential (primary) hypertension: Secondary | ICD-10-CM

## 2022-06-06 DIAGNOSIS — Z1211 Encounter for screening for malignant neoplasm of colon: Secondary | ICD-10-CM | POA: Diagnosis not present

## 2022-06-06 MED ORDER — LISINOPRIL 10 MG PO TABS: 10.0000 mg | ORAL_TABLET | Freq: Every day | ORAL | 1 refills | Status: DC

## 2022-06-06 NOTE — Assessment & Plan Note (Signed)
Under good control on current regimen. Continue current regimen. Continue to monitor. Call with any concerns. Refills given. Labs drawn today.   

## 2022-06-06 NOTE — Progress Notes (Signed)
BP 132/82   Pulse 82   Temp 98.6 F (37 C) (Oral)   Ht 5\' 9"  (1.753 m)   Wt 216 lb 4.8 oz (98.1 kg)   SpO2 97%   BMI 31.94 kg/m    Subjective:    Patient ID: Gary Gray, male    DOB: Apr 03, 1975, 47 y.o.   MRN: [id number]  HPI: Gary Gray is a 47 y.o. male  Chief Complaint  Patient presents with   Hypertension   HYPERTENSION  Hypertension status: controlled  Satisfied with current treatment? yes Duration of hypertension: months BP monitoring frequency:  not checking BP medication side effects:  no Medication compliance: excellent compliance Previous BP meds:lisinopril Aspirin: no Recurrent headaches: no Visual changes: no Palpitations: no Dyspnea: no Chest pain: no Lower extremity edema: no Dizzy/lightheaded: no   Relevant past medical, surgical, family and social history reviewed and updated as indicated. Interim medical history since our last visit reviewed. Allergies and medications reviewed and updated.  Review of Systems  Constitutional: Negative.   Respiratory: Negative.    Cardiovascular: Negative.   Gastrointestinal: Negative.   Musculoskeletal: Negative.   Neurological: Negative.   Psychiatric/Behavioral: Negative.      Per HPI unless specifically indicated above     Objective:    BP 132/82   Pulse 82   Temp 98.6 F (37 C) (Oral)   Ht 5\' 9"  (1.753 m)   Wt 216 lb 4.8 oz (98.1 kg)   SpO2 97%   BMI 31.94 kg/m   Wt Readings from Last 3 Encounters:  06/06/22 216 lb 4.8 oz (98.1 kg)  05/24/22 210 lb (95.3 kg)  05/11/22 210 lb (95.3 kg)    Physical Exam Vitals and nursing note reviewed.  Constitutional:      General: He is not in acute distress.    Appearance: Normal appearance. He is not ill-appearing, toxic-appearing or diaphoretic.  HENT:     Head: Normocephalic and atraumatic.     Right Ear: External ear normal.     Left Ear: External ear normal.     Nose: Nose normal.     Mouth/Throat:     Mouth: Mucous membranes are  moist.     Pharynx: Oropharynx is clear.  Eyes:     General: No scleral icterus.       Right eye: No discharge.        Left eye: No discharge.     Extraocular Movements: Extraocular movements intact.     Conjunctiva/sclera: Conjunctivae normal.     Pupils: Pupils are equal, round, and reactive to light.  Cardiovascular:     Rate and Rhythm: Normal rate and regular rhythm.     Pulses: Normal pulses.     Heart sounds: Normal heart sounds. No murmur heard.    No friction rub. No gallop.  Pulmonary:     Effort: Pulmonary effort is normal. No respiratory distress.     Breath sounds: Normal breath sounds. No stridor. No wheezing, rhonchi or rales.  Chest:     Chest wall: No tenderness.  Musculoskeletal:        General: Normal range of motion.     Cervical back: Normal range of motion and neck supple.  Skin:    General: Skin is warm and dry.     Capillary Refill: Capillary refill takes less than 2 seconds.     Coloration: Skin is not jaundiced or pale.     Findings: No bruising, erythema, lesion or rash.  Neurological:  General: No focal deficit present.     Mental Status: He is alert and oriented to person, place, and time. Mental status is at baseline.  Psychiatric:        Mood and Affect: Mood normal.        Behavior: Behavior normal.        Thought Content: Thought content normal.        Judgment: Judgment normal.     Results for orders placed or performed during the hospital encounter of 05/11/22  ABO/Rh  Result Value Ref Range   ABO/RH(D)      O POS Performed at [facility], [address] [place], [place] [address]       Assessment & Plan:   Problem List Items Addressed This Visit       Cardiovascular and Mediastinum   HTN (hypertension) - Primary    Under good control on current regimen. Continue current regimen. Continue to monitor. Call with any concerns. Refills given. Labs drawn today.       Relevant Medications   lisinopril (ZESTRIL) 10  MG tablet   Other Relevant Orders   Basic metabolic panel   Other Visit Diagnoses     Screening for colon cancer       Referral to GI placed today.   Relevant Orders   Ambulatory referral to Gastroenterology        Follow up plan: Return in about 6 months (around 12/06/2022).

## 2022-06-07 ENCOUNTER — Telehealth: Payer: Self-pay | Admitting: Neurosurgery

## 2022-06-07 LAB — BASIC METABOLIC PANEL
BUN/Creatinine Ratio: 10 (ref 9–20)
BUN: 8 mg/dL (ref 6–24)
CO2: 21 mmol/L (ref 20–29)
Calcium: 9.5 mg/dL (ref 8.7–10.2)
Chloride: 102 mmol/L (ref 96–106)
Creatinine, Ser: 0.77 mg/dL (ref 0.76–1.27)
Glucose: 91 mg/dL (ref 70–99)
Potassium: 4.2 mmol/L (ref 3.5–5.2)
Sodium: 138 mmol/L (ref 134–144)
eGFR: 112 mL/min/{1.73_m2} (ref >59)

## 2022-06-07 NOTE — Telephone Encounter (Signed)
C6-7 arthroplasty on 05/11/22//  Patient's supervisor is telling the patient that if he can get a note stating that patient's symptoms were caused by repetitive motion by his job that he could get reimbursed for his missed time. This will give him additional time off.

## 2022-06-07 NOTE — Telephone Encounter (Signed)
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

## 2022-06-07 NOTE — Telephone Encounter (Signed)
Can one of you let Gary Gray know that we will have to discuss this with Dr Izora Ribas when he is back in the office next week? Thanks

## 2022-06-08 NOTE — Telephone Encounter (Signed)
Left message for patient that we will call him once Dr.Yarbrough reviews message, it will be next week.

## 2022-06-13 ENCOUNTER — Encounter: Payer: Self-pay | Admitting: *Deleted

## 2022-06-13 NOTE — Telephone Encounter (Signed)
I wrote a note based on what Dr Myer Haff said and I notified the patient via mychart.

## 2022-06-22 ENCOUNTER — Other Ambulatory Visit: Payer: Self-pay

## 2022-06-22 DIAGNOSIS — M5412 Radiculopathy, cervical region: Secondary | ICD-10-CM

## 2022-06-23 ENCOUNTER — Encounter: Payer: Self-pay | Admitting: Neurosurgery

## 2022-06-23 ENCOUNTER — Ambulatory Visit
Admission: RE | Admit: 2022-06-23 | Discharge: 2022-06-23 | Disposition: A | Discharge status: Home / Self Care | Attending: Neurosurgery | Admitting: Neurosurgery

## 2022-06-23 ENCOUNTER — Ambulatory Visit (INDEPENDENT_AMBULATORY_CARE_PROVIDER_SITE_OTHER): Payer: Worker's Compensation | Admitting: Neurosurgery

## 2022-06-23 ENCOUNTER — Ambulatory Visit
Admission: RE | Admit: 2022-06-23 | Discharge: 2022-06-23 | Disposition: A | Discharge status: Home / Self Care | Source: Ambulatory Visit | Attending: Neurosurgery | Admitting: Neurosurgery

## 2022-06-23 VITALS — BP 126/74 | Temp 98.6°F | Ht 69.0 in | Wt 214.6 lb

## 2022-06-23 DIAGNOSIS — M5412 Radiculopathy, cervical region: Secondary | ICD-10-CM

## 2022-06-23 DIAGNOSIS — Z09 Encounter for follow-up examination after completed treatment for conditions other than malignant neoplasm: Secondary | ICD-10-CM

## 2022-06-23 DIAGNOSIS — M50323 Other cervical disc degeneration at C6-C7 level: Secondary | ICD-10-CM | POA: Diagnosis not present

## 2022-06-23 NOTE — Progress Notes (Signed)
   REFERRING PHYSICIAN:  [facility], [facility] [address] [place],  [place] [address]  DOS: 05/11/22 C6-7 arthroplasty  HISTORY OF PRESENT ILLNESS: LENOX BINK is status post cervical arthroplasty.   He is doing very well.  His pain is much improved.  PHYSICAL EXAMINATION:  NEUROLOGICAL:  General: In no acute distress.   Awake, alert, oriented to person, place, and time.  Pupils equal round and reactive to light.  Facial tone is symmetric.  Strength: Side Biceps Triceps Deltoid Interossei Grip Wrist Ext. Wrist Flex.  R L Incision c/d/I and healing well  Imaging:  No complications noted.  Assessment / Plan: ASPEN DETERDING is doing very well.  He will return to work in a couple of weeks.  We reviewed his activity limitations.  Will see him back in follow-up in 6 weeks.    Venetia Night Dept of Neurosurgery

## 2022-07-28 ENCOUNTER — Other Ambulatory Visit: Payer: Self-pay

## 2022-07-28 DIAGNOSIS — M5412 Radiculopathy, cervical region: Secondary | ICD-10-CM

## 2022-07-29 NOTE — Progress Notes (Unsigned)
   REFERRING PHYSICIAN:  [facility], [facility] [address] [place],  [place] [address]  DOS: 05/11/22 C6-7 arthroplasty  HISTORY OF PRESENT ILLNESS: Gary Gray is almost 3 months status post cervical arthroplasty.   He was doing well at his last visit.   He is doing great with no neck or arm pain. No numbness, tingling, or weakness.  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    PHYSICAL EXAMINATION:  NEUROLOGICAL:  General: In no acute distress.   Awake, alert, oriented to person, place, and time.  Pupils equal round and reactive to light.  Facial tone is symmetric.   Strength: Side Biceps Triceps Deltoid Interossei Grip Wrist Ext. Wrist Flex.  R 5 5 5 5 5 5 5   L 5 5 5 5 5 5 5    Incision well healed   Imaging:  Cervical xrays dated 08/02/22:  No complications noted.    Assessment / Plan: Gary Gray is doing well 3 months s/p cervical arthroplasty.  Treatment options reviewed with patient and following plan made:   - Continue with 25 pound lifting limit at work until 08/11/22 then he may go up to 50 pound lifting limit until his follow up in August.  - BP was elevated. No symptoms of chest pain, shortness of breath, blurry vision, or headaches.  - He will get BP cuff to recheck at home and call PCP if not improved. If he develops CP, SOB, blurry vision, or headaches, then he will go to ED.    - Follow up with Dr. Myer Haff in 3 months and prn.   Advised to contact the office if any questions or concerns arise.   Drake Leach PA-C Dept of Neurosurgery

## 2022-08-02 ENCOUNTER — Ambulatory Visit
Admission: RE | Admit: 2022-08-02 | Discharge: 2022-08-02 | Disposition: A | Discharge status: Home / Self Care | Source: Ambulatory Visit | Attending: Orthopedic Surgery | Admitting: Orthopedic Surgery

## 2022-08-02 ENCOUNTER — Encounter: Payer: Self-pay | Admitting: Orthopedic Surgery

## 2022-08-02 ENCOUNTER — Ambulatory Visit (INDEPENDENT_AMBULATORY_CARE_PROVIDER_SITE_OTHER): Payer: Worker's Compensation | Admitting: Orthopedic Surgery

## 2022-08-02 ENCOUNTER — Ambulatory Visit
Admission: RE | Admit: 2022-08-02 | Discharge: 2022-08-02 | Disposition: A | Discharge status: Home / Self Care | Attending: Orthopedic Surgery | Admitting: Orthopedic Surgery

## 2022-08-02 VITALS — BP 140/100 | Ht 69.0 in | Wt 214.0 lb

## 2022-08-02 DIAGNOSIS — M5412 Radiculopathy, cervical region: Secondary | ICD-10-CM | POA: Diagnosis not present

## 2022-08-02 DIAGNOSIS — M4802 Spinal stenosis, cervical region: Secondary | ICD-10-CM | POA: Diagnosis not present

## 2022-08-02 DIAGNOSIS — Z09 Encounter for follow-up examination after completed treatment for conditions other than malignant neoplasm: Secondary | ICD-10-CM

## 2022-08-02 DIAGNOSIS — Z9889 Other specified postprocedural states: Secondary | ICD-10-CM

## 2022-08-11 ENCOUNTER — Ambulatory Visit: Payer: Self-pay | Admitting: *Deleted

## 2022-08-11 NOTE — Telephone Encounter (Signed)
Reason for Disposition  Systolic BP  >= 160 OR Diastolic >= 100  Answer Assessment - Initial Assessment Questions 1. BLOOD PRESSURE: "What is the blood pressure?" "Did you take at least two measurements 5 minutes apart?"     Went to neurosurgeon and his BP is elevated.   152/110 at his office.  148/102.   They suggested I get a BP cuff.    I bought one.   It's 148/110 during the day.   I take my BP med. At dinner time.   After my BP med it comes down.   I'm not having symptoms.   It has stayed elevated for the last week and a half.   2. ONSET: "When did you take your blood pressure?"     Last night I checked it last after taking BP med 3. HOW: "How did you take your blood pressure?" (e.g., automatic home BP monitor, visiting nurse)     Cuff at home. 4. HISTORY: "Do you have a history of high blood pressure?"     Yes since the nerve damage.   They replaced the disc in my neck at the end of April. 5. MEDICINES: "Are you taking any medicines for blood pressure?" "Have you missed any doses recently?"     Yes   Lisinopril   6. OTHER SYMPTOMS: "Do you have any symptoms?" (e.g., blurred vision, chest pain, difficulty breathing, headache, weakness)     No other symptoms.   7. PREGNANCY: "Is there any chance you are pregnant?" "When was your last menstrual period?"     N?A  Protocols used: Blood Pressure - High-A-AH

## 2022-08-11 NOTE — Telephone Encounter (Signed)
  Chief Complaint: Elevated BP without symptoms.  BP noted to be elevated after seeing his neurosurgeon and it being elevated in his office.  Bought a cuff and monitoring it and it's still elevated during the day.   After taking his Lisinopril in the evening his BP comes down. Symptoms: Denies any symptoms.  No headaches, dizziness, blurred vision. Frequency: Noticed BP being elevated since having a disc repair/replaced in his neck at the end of April.   Pertinent Negatives: Patient denies Symptoms of any kind. Disposition: [] ED /[] Urgent Care (no appt availability in office) / [x] Appointment(In office/virtual)/ []  [facility]/ [] Home Care/ [] Refused Recommended Disposition /[] [facility]/ []  Follow-up with PCP Additional Notes: Made him an appt with Dr. Laural Benes for 08/15/2022 at 3:00.   Went over the symptoms to go to the ED for.   He was agreeable to this plan.

## 2022-08-15 ENCOUNTER — Ambulatory Visit: Payer: Federal, State, Local not specified - PPO | Admitting: Family Medicine

## 2022-08-15 ENCOUNTER — Encounter: Payer: Self-pay | Admitting: Family Medicine

## 2022-08-15 VITALS — BP 139/89 | HR 80 | Temp 98.6°F | Ht 69.0 in | Wt 218.4 lb

## 2022-08-15 DIAGNOSIS — I10 Essential (primary) hypertension: Secondary | ICD-10-CM | POA: Diagnosis not present

## 2022-08-15 MED ORDER — LISINOPRIL 20 MG PO TABS: 20.0000 mg | ORAL_TABLET | Freq: Every day | ORAL | 1 refills | Status: DC

## 2022-08-15 NOTE — Progress Notes (Signed)
BP 139/89   Pulse 80   Temp 98.6 F (37 C) (Oral)   Ht 5\' 9"  (1.753 m)   Wt 218 lb 6.4 oz (99.1 kg)   SpO2 95%   BMI 32.25 kg/m    Subjective:    Patient ID: Gary Gray, male    DOB: 1976/01/30, 47 y.o.   MRN: [id number]  HPI: [name] is a 47 y.o. male  Chief Complaint  Patient presents with   Hypertension    Patient says he has noticed some elevation in his BP readings and says he has noticed top number ranging from 140-150 and bottom number between 100-110. Patient says he has gotten a little better.    HYPERTENSION  Hypertension status: running high at home  Satisfied with current treatment? no Duration of hypertension: chronic BP monitoring frequency:  a few times a month BP range: 145-148/100-111 BP medication side effects:  no Medication compliance: excellent compliance Previous BP meds:lisinopril Aspirin: no Recurrent headaches: no Visual changes: no Palpitations: no Dyspnea: no Chest pain: no Lower extremity edema: no Dizzy/lightheaded: no  Relevant past medical, surgical, family and social history reviewed and updated as indicated. Interim medical history since our last visit reviewed. Allergies and medications reviewed and updated.  Review of Systems  Constitutional: Negative.   Respiratory: Negative.    Cardiovascular: Negative.   Gastrointestinal: Negative.   Musculoskeletal: Negative.   Neurological: Negative.   Psychiatric/Behavioral: Negative.      Per HPI unless specifically indicated above     Objective:    BP 139/89   Pulse 80   Temp 98.6 F (37 C) (Oral)   Ht 5\' 9"  (1.753 m)   Wt 218 lb 6.4 oz (99.1 kg)   SpO2 95%   BMI 32.25 kg/m   Wt Readings from Last 3 Encounters:  08/15/22 218 lb 6.4 oz (99.1 kg)  08/02/22 214 lb (97.1 kg)  06/23/22 214 lb 9.6 oz (97.3 kg)    Physical Exam Vitals and nursing note reviewed.  Constitutional:      General: He is not in acute distress.    Appearance: Normal appearance. He is not  ill-appearing, toxic-appearing or diaphoretic.  HENT:     Head: Normocephalic and atraumatic.     Right Ear: External ear normal.     Left Ear: External ear normal.     Nose: Nose normal.     Mouth/Throat:     Mouth: Mucous membranes are moist.     Pharynx: Oropharynx is clear.  Eyes:     General: No scleral icterus.       Right eye: No discharge.        Left eye: No discharge.     Extraocular Movements: Extraocular movements intact.     Conjunctiva/sclera: Conjunctivae normal.     Pupils: Pupils are equal, round, and reactive to light.  Cardiovascular:     Rate and Rhythm: Normal rate and regular rhythm.     Pulses: Normal pulses.     Heart sounds: Normal heart sounds. No murmur heard.    No friction rub. No gallop.  Pulmonary:     Effort: Pulmonary effort is normal. No respiratory distress.     Breath sounds: Normal breath sounds. No stridor. No wheezing, rhonchi or rales.  Chest:     Chest wall: No tenderness.  Musculoskeletal:        General: Normal range of motion.     Cervical back: Normal range of motion and neck supple.  Skin:    General: Skin is warm and dry.     Capillary Refill: Capillary refill takes less than 2 seconds.     Coloration: Skin is not jaundiced or pale.     Findings: No bruising, erythema, lesion or rash.  Neurological:     General: No focal deficit present.     Mental Status: He is alert and oriented to person, place, and time. Mental status is at baseline.  Psychiatric:        Mood and Affect: Mood normal.        Behavior: Behavior normal.        Thought Content: Thought content normal.        Judgment: Judgment normal.     Results for orders placed or performed in visit on 06/06/22  Basic metabolic panel  Result Value Ref Range   Glucose 91 70 - 99 mg/dL   BUN 8 6 - 24 mg/dL   Creatinine, Ser 1.61 0.76 - 1.27 mg/dL   eGFR 096 >04 VW/UJW/1.19   BUN/Creatinine Ratio 10 9 - 20   Sodium 138 134 - 144 mmol/L   Potassium 4.2 3.5 - 5.2  mmol/L   Chloride 102 96 - 106 mmol/L   CO2 21 20 - 29 mmol/L   Calcium 9.5 8.7 - 10.2 mg/dL      Assessment & Plan:   Problem List Items Addressed This Visit       Cardiovascular and Mediastinum   HTN (hypertension) - Primary    Will increase his lisinopril to 20mg  and recheck 2 months. Call with any concerns. Continue to monitor.      Relevant Medications   lisinopril (ZESTRIL) 20 MG tablet     Follow up plan: Return in about 2 months (around 10/15/2022).

## 2022-08-15 NOTE — Assessment & Plan Note (Signed)
Will increase his lisinopril to 20mg  and recheck 2 months. Call with any concerns. Continue to monitor.

## 2022-08-17 ENCOUNTER — Telehealth: Payer: Self-pay | Admitting: Neurosurgery

## 2022-08-17 NOTE — Telephone Encounter (Signed)
Pt's wife came by today stating that there was some information missing from the documentation for his job that we had previously given. She gave me a document from the [employer] stating: "..in response to our letter, we have received form CA-3 dated 07/08/2022; questionnaire answers dated 07/08/2022; medical reports dated 02/22/2022, 03/11/2022, 04/26/2022, 05/24/2022, 06/23/2022; hospital report dated 05/11/2022. This evidence is not sufficient because we have not received a physician's opinion as to how employment activities caused, contributed to, or aggravated your medical condition has not been provided. 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 The document does not include a discussion of your specific work activities, and the opinion is speculative at best. A medical opinion must be well rationalized, reliable, unequivocal, and probative. Without necessary explanatory rationale, this is insufficient to establish a relationship between accepted work factors and the diagnosed condition. "  The pt's wife stated to further the claim they need: "The physician's opinion supported by a medical explanation as to how the reported work incident caused or aggravated a medical condition."  They are asking for this letter to be completed by Dr. Myer Haff and they will come pick it up when it's ready.

## 2022-08-23 ENCOUNTER — Encounter: Payer: Self-pay | Admitting: Neurosurgery

## 2022-10-04 ENCOUNTER — Encounter: Payer: Self-pay | Admitting: Neurosurgery

## 2022-10-17 ENCOUNTER — Ambulatory Visit (INDEPENDENT_AMBULATORY_CARE_PROVIDER_SITE_OTHER): Payer: Federal, State, Local not specified - PPO | Admitting: Family Medicine

## 2022-10-17 ENCOUNTER — Encounter: Payer: Self-pay | Admitting: Family Medicine

## 2022-10-17 VITALS — BP 118/72 | HR 77 | Temp 98.5°F | Wt 214.0 lb

## 2022-10-17 DIAGNOSIS — Z1211 Encounter for screening for malignant neoplasm of colon: Secondary | ICD-10-CM

## 2022-10-17 DIAGNOSIS — F320 Major depressive disorder, single episode, mild: Secondary | ICD-10-CM | POA: Insufficient documentation

## 2022-10-17 DIAGNOSIS — I10 Essential (primary) hypertension: Secondary | ICD-10-CM

## 2022-10-17 MED ORDER — FLUOXETINE HCL 10 MG PO CAPS: 10.0000 mg | ORAL_CAPSULE | Freq: Every day | ORAL | 3 refills | Status: DC

## 2022-10-17 NOTE — Assessment & Plan Note (Signed)
Will start low dose prozac. Recheck 1-2 months. Call with any concerns.

## 2022-10-17 NOTE — Assessment & Plan Note (Signed)
Under good control on current regimen. Continue current regimen. Continue to monitor. Call with any concerns. Refills given. Labs drawn today.   

## 2022-10-17 NOTE — Progress Notes (Signed)
BP 118/72   Pulse 77   Temp 98.5 F (36.9 C) (Oral)   Wt 214 lb (97.1 kg)   SpO2 96%   BMI 31.60 kg/m    Subjective:    Patient ID: Gary Gray, male    DOB: 04-11-1975, 47 y.o.   MRN: [id number]  HPI: Gary Gray is a 47 y.o. male  Chief Complaint  Patient presents with   Hypertension   HYPERTENSION  Hypertension status: controlled  Satisfied with current treatment? yes Duration of hypertension: chronic BP monitoring frequency:  rarely BP medication side effects:  no Medication compliance: excellent compliance Previous BP meds:lisinopril Aspirin: no Recurrent headaches: no Visual changes: no Palpitations: no Dyspnea: no Chest pain: no Lower extremity edema: no Dizzy/lightheaded: no  DEPRESSION Mood status: uncontrolled Satisfied with current treatment?: N/A Symptom severity: mild  Duration of current treatment : none Psychotherapy/counseling: no  Previous psychiatric medications: none Depressed mood: yes Anxious mood: no Anhedonia: no Significant weight loss or gain: no Insomnia: no  Fatigue: no Feelings of worthlessness or guilt: no Impaired concentration/indecisiveness: no Suicidal ideations: no Hopelessness: no Crying spells: yes    10/17/2022    2:28 PM 08/15/2022    2:50 PM 06/06/2022    8:56 AM 05/02/2022    9:23 AM 05/19/2021    3:38 PM  Depression screen PHQ 2/9  Decreased Interest 1 0 0 0 0  Down, Depressed, Hopeless 1 1 1  0 0  PHQ - 2 Score 2 1 1  0 0  Altered sleeping 0 0 0 0 0  Tired, decreased energy 1 0 0 0 0  Change in appetite 0 0 0 0 0  Feeling bad or failure about yourself  1 0 0 0 0  Trouble concentrating 0 0 0 0 0  Moving slowly or fidgety/restless 0 0 0 0 0  Suicidal thoughts 0 0 0 0 0  PHQ-9 Score 4 1 1  0 0  Difficult doing work/chores Not difficult at all Not difficult at all Not difficult at all Not difficult at all Not difficult at all     Relevant past medical, surgical, family and social history reviewed and  updated as indicated. Interim medical history since our last visit reviewed. Allergies and medications reviewed and updated.  Review of Systems  Constitutional: Negative.   Respiratory: Negative.    Cardiovascular: Negative.   Gastrointestinal: Negative.   Musculoskeletal: Negative.   Psychiatric/Behavioral:  Positive for dysphoric mood. Negative for agitation, behavioral problems, confusion, decreased concentration, hallucinations, self-injury, sleep disturbance and suicidal ideas. The patient is nervous/anxious. The patient is not hyperactive.     Per HPI unless specifically indicated above     Objective:    BP 118/72   Pulse 77   Temp 98.5 F (36.9 C) (Oral)   Wt 214 lb (97.1 kg)   SpO2 96%   BMI 31.60 kg/m   Wt Readings from Last 3 Encounters:  10/17/22 214 lb (97.1 kg)  08/15/22 218 lb 6.4 oz (99.1 kg)  08/02/22 214 lb (97.1 kg)    Physical Exam Vitals and nursing note reviewed.  Constitutional:      General: He is not in acute distress.    Appearance: Normal appearance. He is normal weight. He is not ill-appearing, toxic-appearing or diaphoretic.  HENT:     Head: Normocephalic and atraumatic.     Right Ear: External ear normal.     Left Ear: External ear normal.     Nose: Nose normal.  Mouth/Throat:     Mouth: Mucous membranes are moist.     Pharynx: Oropharynx is clear.  Eyes:     General: No scleral icterus.       Right eye: No discharge.        Left eye: No discharge.     Extraocular Movements: Extraocular movements intact.     Conjunctiva/sclera: Conjunctivae normal.     Pupils: Pupils are equal, round, and reactive to light.  Cardiovascular:     Rate and Rhythm: Normal rate and regular rhythm.     Pulses: Normal pulses.     Heart sounds: Normal heart sounds. No murmur heard.    No friction rub. No gallop.  Pulmonary:     Effort: Pulmonary effort is normal. No respiratory distress.     Breath sounds: Normal breath sounds. No stridor. No wheezing,  rhonchi or rales.  Chest:     Chest wall: No tenderness.  Musculoskeletal:        General: Normal range of motion.     Cervical back: Normal range of motion and neck supple.  Skin:    General: Skin is warm and dry.     Capillary Refill: Capillary refill takes less than 2 seconds.     Coloration: Skin is not jaundiced or pale.     Findings: No bruising, erythema, lesion or rash.  Neurological:     General: No focal deficit present.     Mental Status: He is alert and oriented to person, place, and time. Mental status is at baseline.  Psychiatric:        Mood and Affect: Mood normal.        Behavior: Behavior normal.        Thought Content: Thought content normal.        Judgment: Judgment normal.     Results for orders placed or performed in visit on 06/06/22  Basic metabolic panel  Result Value Ref Range   Glucose 91 70 - 99 mg/dL   BUN 8 6 - 24 mg/dL   Creatinine, Ser 7.25 0.76 - 1.27 mg/dL   eGFR 366 >44 IH/KVQ/2.59   BUN/Creatinine Ratio 10 9 - 20   Sodium 138 134 - 144 mmol/L   Potassium 4.2 3.5 - 5.2 mmol/L   Chloride 102 96 - 106 mmol/L   CO2 21 20 - 29 mmol/L   Calcium 9.5 8.7 - 10.2 mg/dL      Assessment & Plan:   Problem List Items Addressed This Visit       Cardiovascular and Mediastinum   HTN (hypertension) - Primary    Under good control on current regimen. Continue current regimen. Continue to monitor. Call with any concerns. Refills given. Labs drawn today.       Relevant Orders   Basic metabolic panel     Other   Depression, major, single episode, mild (HCC)    Will start low dose prozac. Recheck 1-2 months. Call with any concerns.       Relevant Medications   FLUoxetine (PROZAC) 10 MG capsule   Other Visit Diagnoses     Screening for colon cancer       Referral to GI placed today.   Relevant Orders   Ambulatory referral to Gastroenterology        Follow up plan: Return 1-2 months, virtual OK.

## 2022-10-27 ENCOUNTER — Encounter: Payer: Self-pay | Admitting: *Deleted

## 2022-10-28 IMAGING — DX DG SHOULDER 2+V*R*
3 series · 3 of 3 positions shown · non-contrast
Comparison: None.

CLINICAL DATA: Right shoulder pain for the past year. No known
injury.

EXAM:
RIGHT SHOULDER - 2+ VIEW

[shoulder ap]
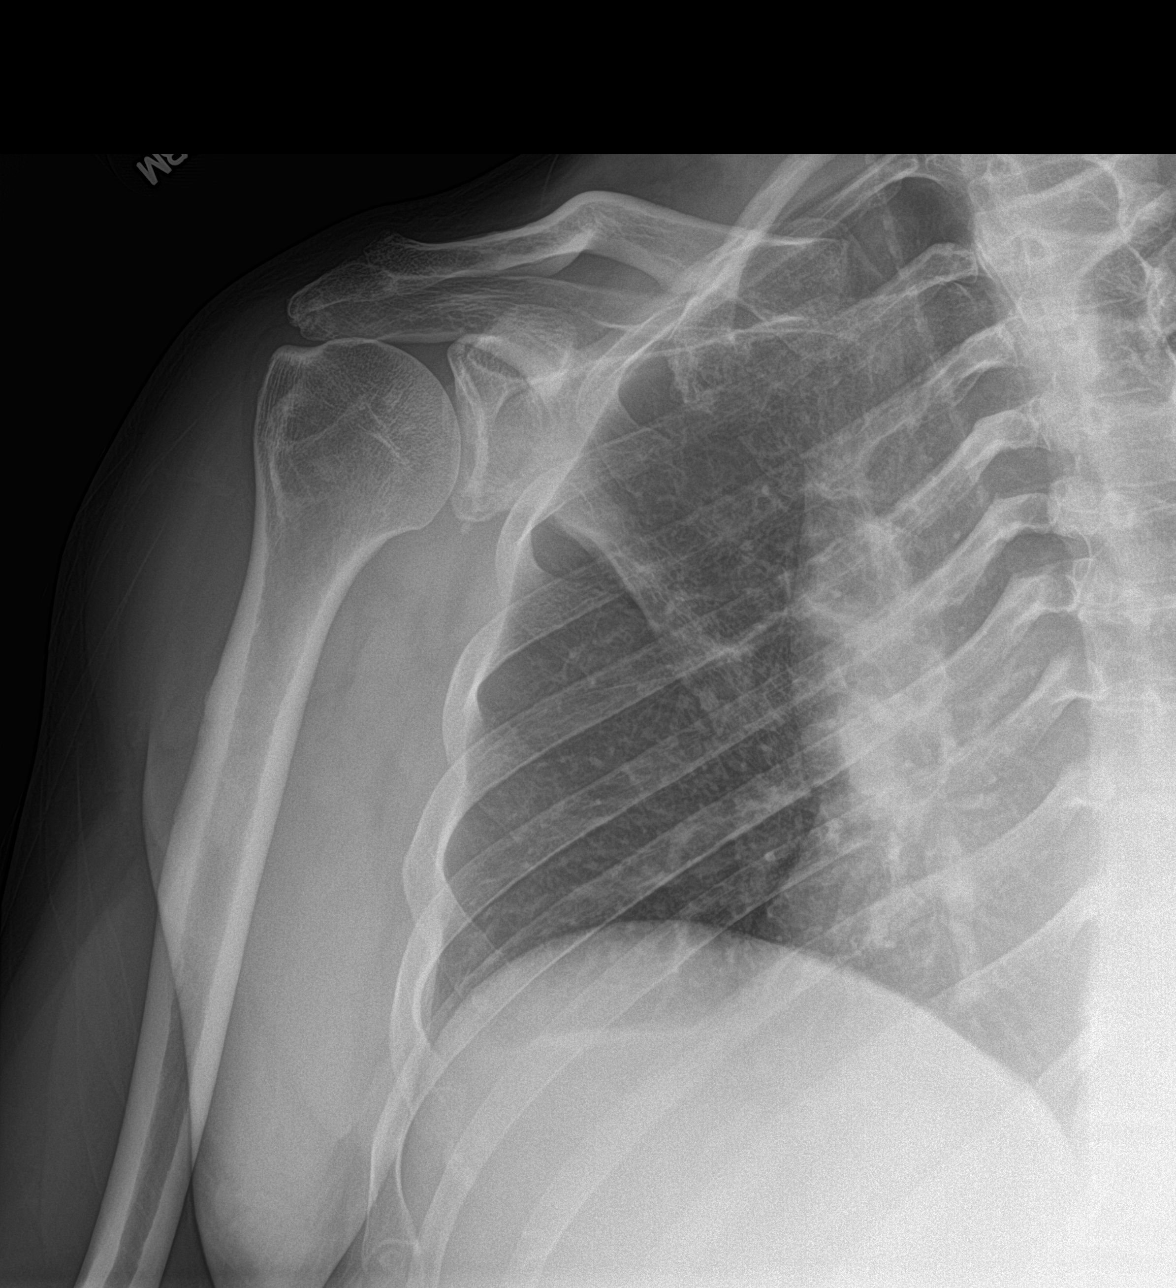

[shoulder y-view]
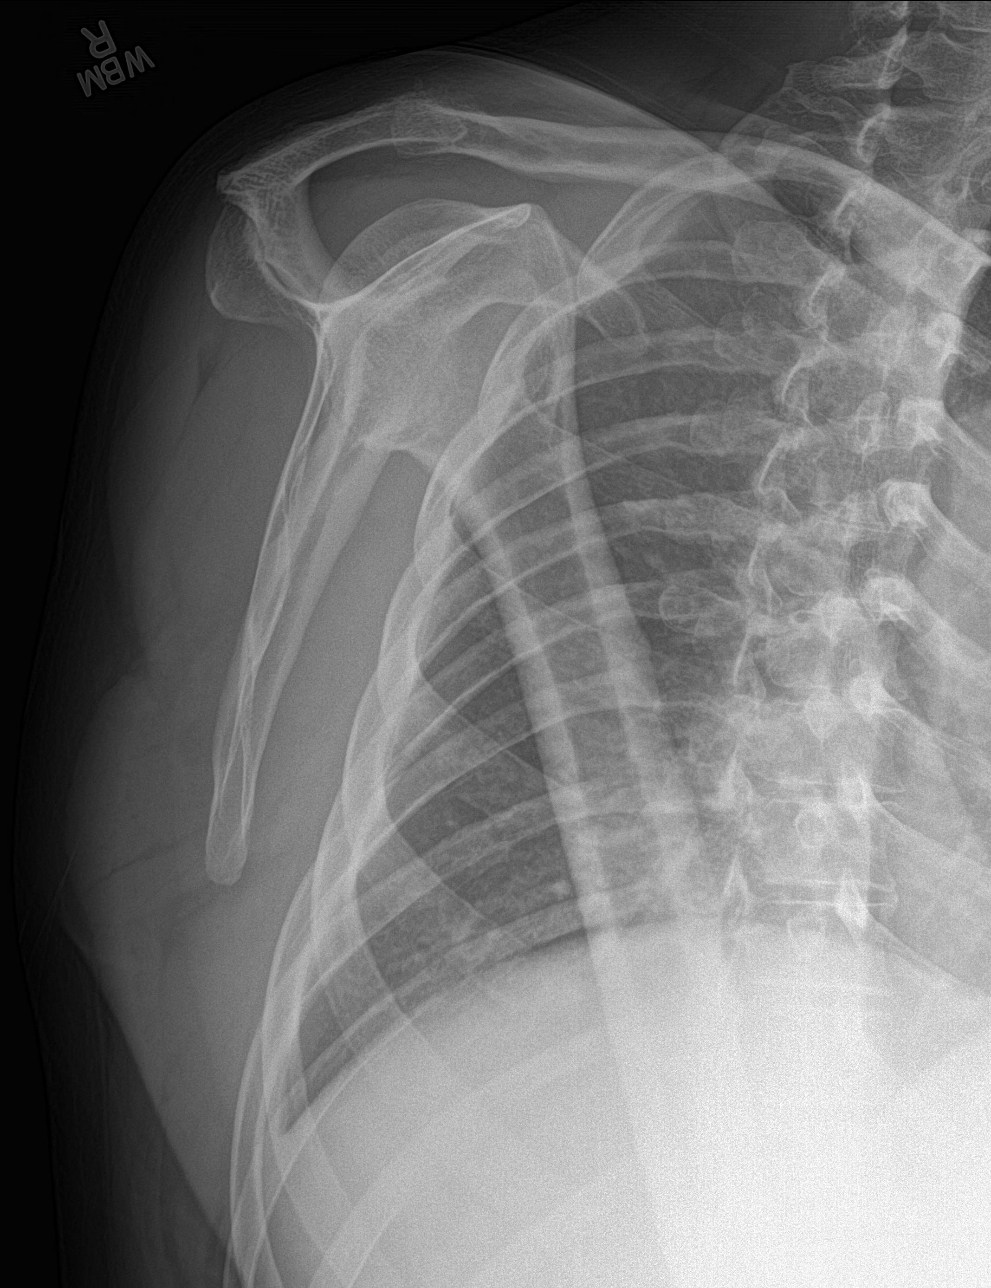

[shoulder axial]
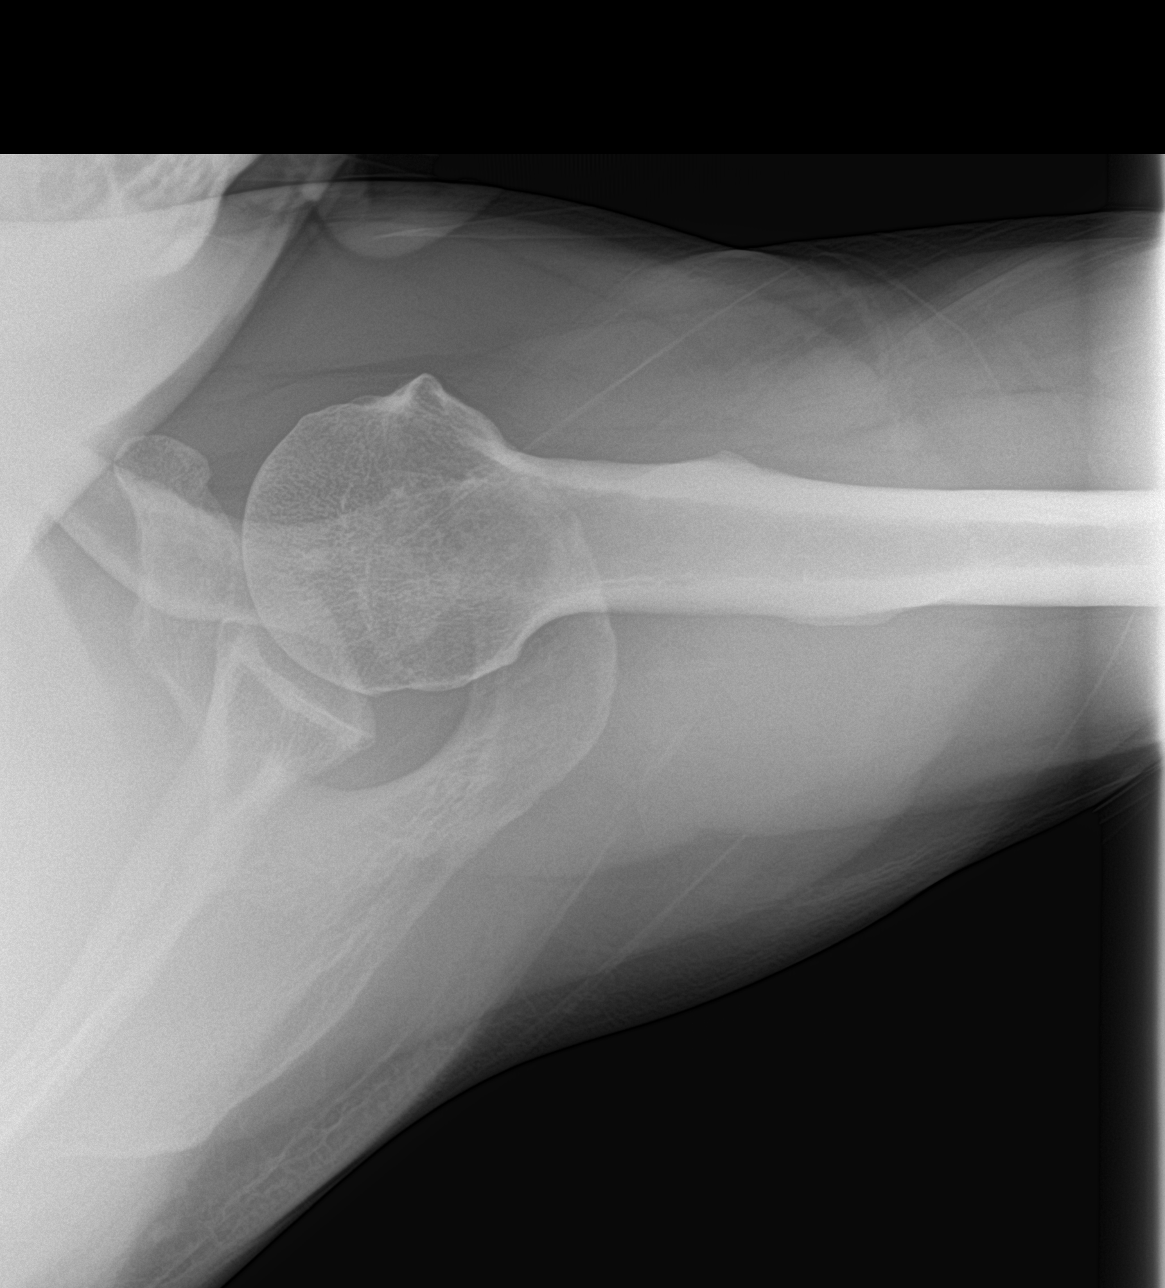

[3 of 3 positions shown; findings below may reference images not displayed]

FINDINGS: No fracture or dislocation. Mild to moderate degenerative change of
the right glenohumeral joint with joint space loss, subchondral
sclerosis and osteophytosis. There is a punctate (3 mm) ossicle
adjacent to the inferior aspect of the glenoid potentially
representative of an intra-articular loose body though a discrete
donor site is not identified. Acromioclavicular joint spaces appear
preserved. Minimal enthesopathic change involving the rotator cuff
insertion site. No evidence of calcific tendinitis. Limited
visualization of the adjacent thorax is normal. Regional soft
tissues appear normal.
IMPRESSION: 1. Mild-to-moderate degenerative change the right glenohumeral
joint.
2. Punctate (3 mm) ossicle inferior to the glenoid may represent a
intra-articular loose body though a discrete donor site is not
identified.
3. Minimal enthesopathic change involving the rotator cuff insertion
site, nonspecific though could be seen in the setting of rotator
cuff pathology.

## 2022-10-28 IMAGING — DX DG LUMBAR SPINE COMPLETE 4+V
5 series · 5 of 5 positions shown · non-contrast
Comparison: None.

CLINICAL DATA: Low back pain for many years remote history of fall.
No recent injury.

EXAM:
LUMBAR SPINE - COMPLETE 4+ VIEW

[l-spine ap]
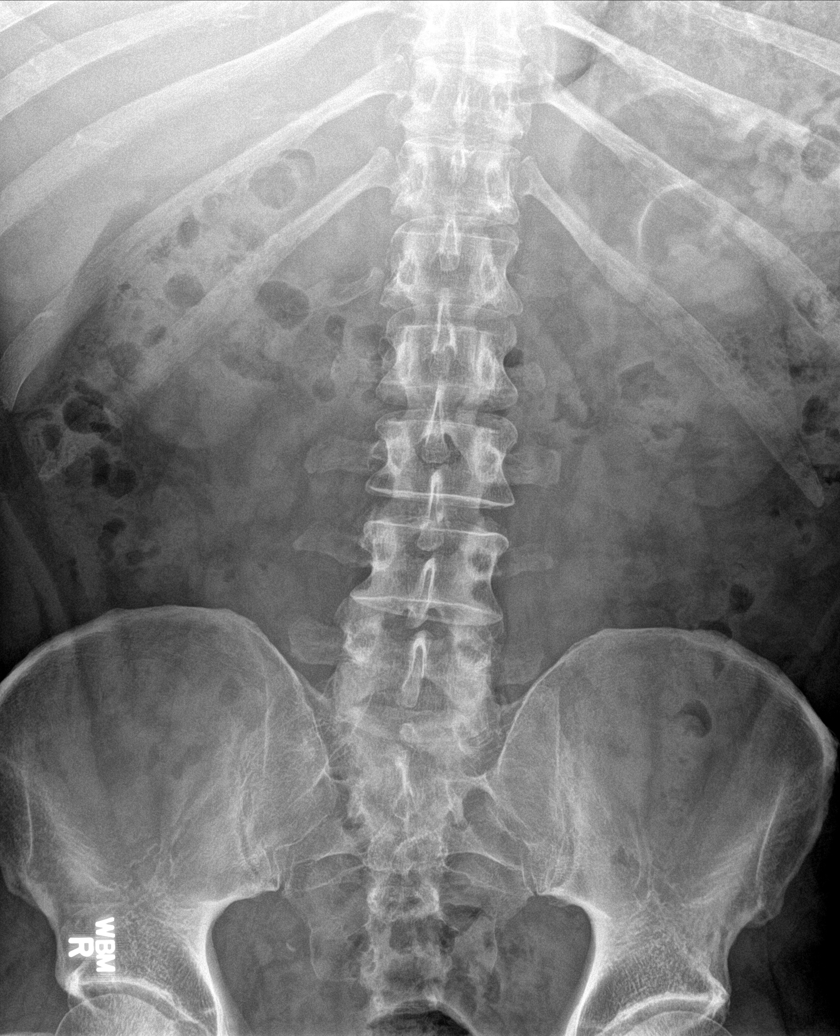

[l-spine obl (1 of 2)]
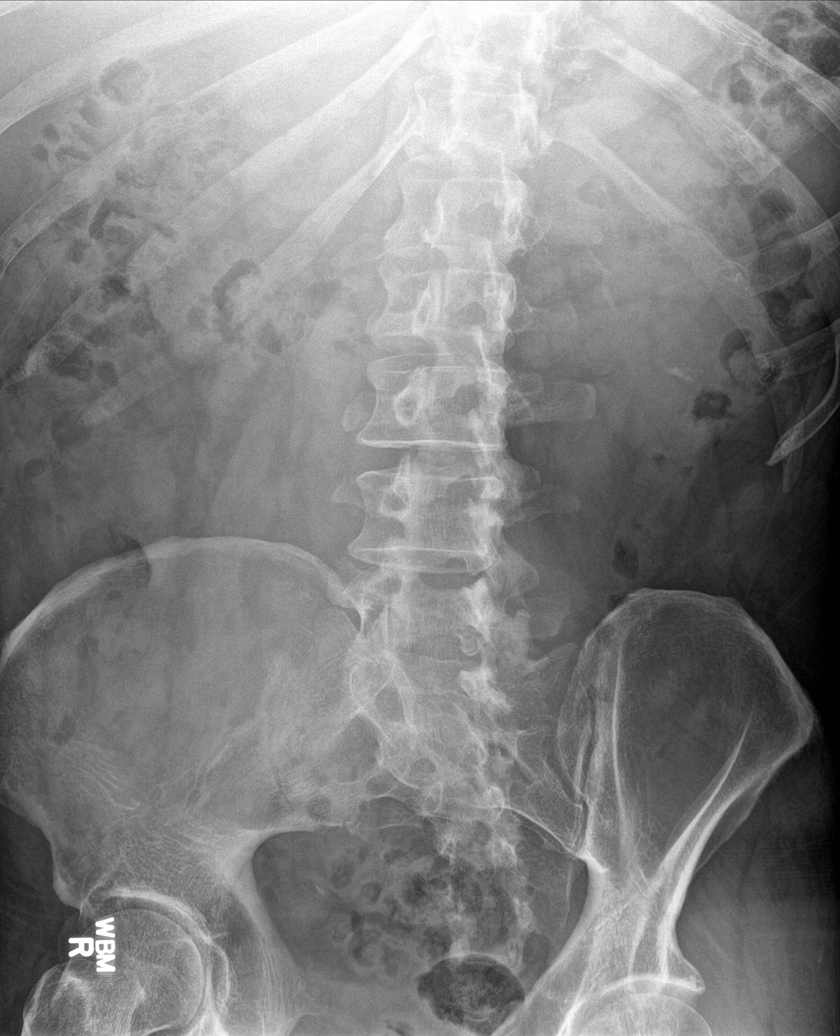

[l-spine obl (2 of 2)]
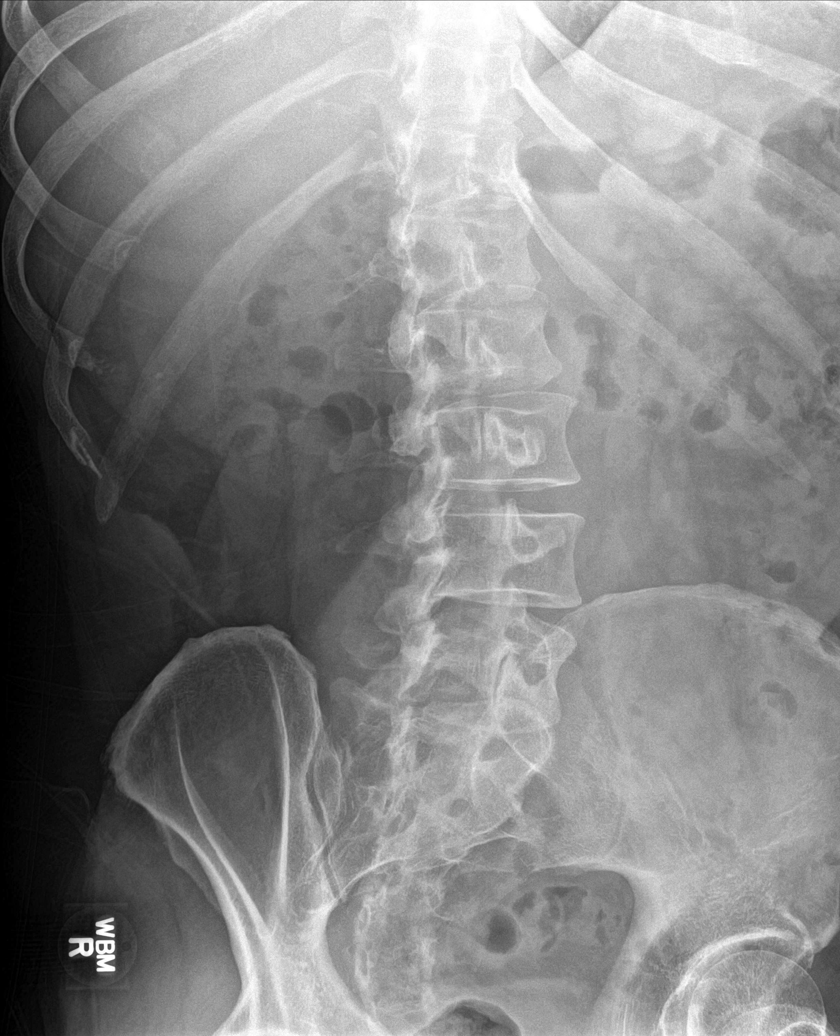

[l-spine lat]
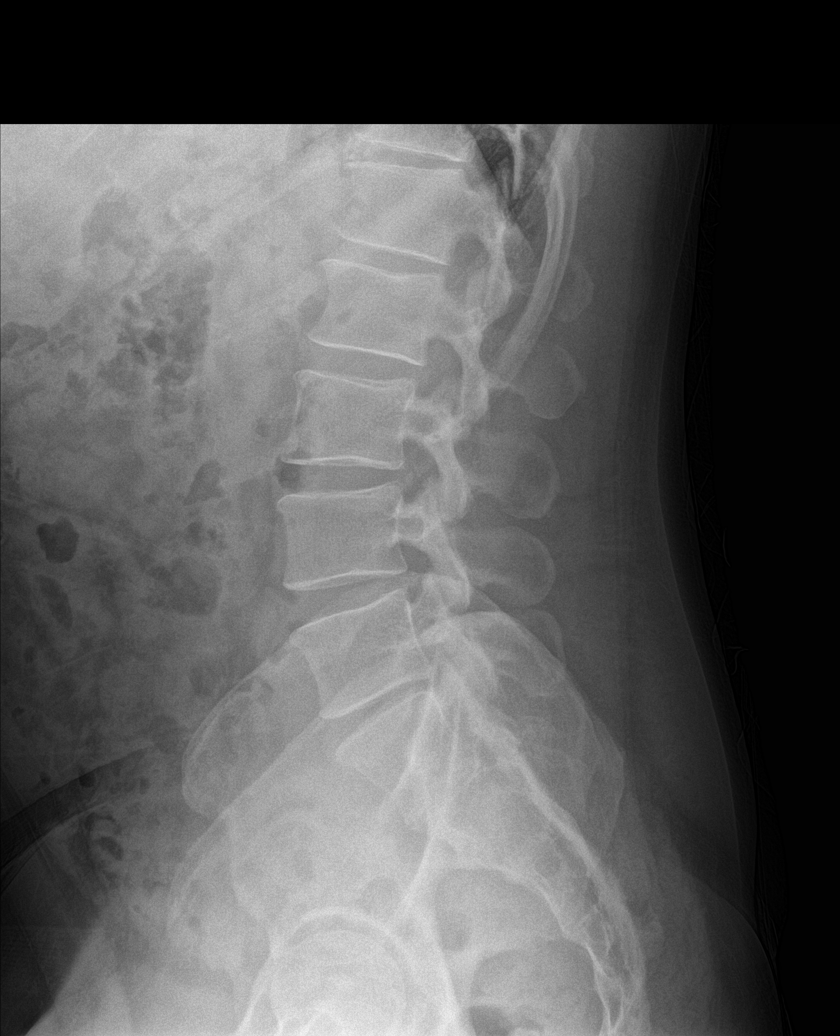

[l-spine spot]
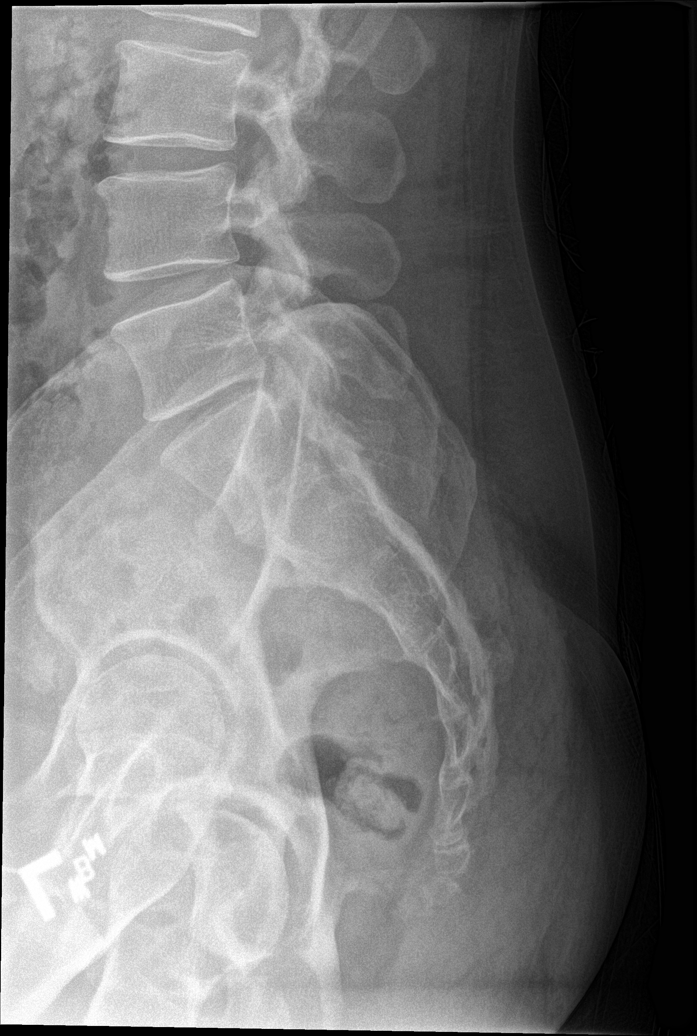

[5 of 5 positions shown; findings below may reference images not displayed]

FINDINGS: There is a suspected solitary right-sided rib at L1. For the
purposes of this dictation, this level will be labeled L1.

Normal alignment of the lumbar spine. No anterolisthesis or
retrolisthesis.

Mild (approximately 25%) age-indeterminate compression deformities
involving the T12 and L1 vertebral bodies.

Mild multilevel lumbar spine DDD, worse at T12-L1 and L1-L2 with
disc space height loss, endplate irregularity and sclerosis.

Limited visualization of the bilateral SI joints is normal.

Regional bowel gas pattern and soft tissues are normal.
IMPRESSION: 1. Age-indeterminate mild (approximately 25%) compression
deformities involving the T12 and L1 vertebral bodies. Correlation
for point tenderness at these locations is advised.
2. Mild multilevel lumbar spine DDD, worse at T12-L1 and L1-L2.

## 2022-11-02 ENCOUNTER — Other Ambulatory Visit: Payer: Self-pay

## 2022-11-02 DIAGNOSIS — M5412 Radiculopathy, cervical region: Secondary | ICD-10-CM

## 2022-11-03 ENCOUNTER — Ambulatory Visit
Admission: RE | Admit: 2022-11-03 | Discharge: 2022-11-03 | Disposition: A | Discharge status: Home / Self Care | Payer: Federal, State, Local not specified - PPO | Attending: Neurosurgery | Admitting: Neurosurgery

## 2022-11-03 ENCOUNTER — Ambulatory Visit
Admission: RE | Admit: 2022-11-03 | Discharge: 2022-11-03 | Disposition: A | Discharge status: Home / Self Care | Payer: Federal, State, Local not specified - PPO | Source: Ambulatory Visit | Attending: Neurosurgery | Admitting: Neurosurgery

## 2022-11-03 ENCOUNTER — Encounter: Payer: Self-pay | Admitting: Neurosurgery

## 2022-11-03 ENCOUNTER — Ambulatory Visit (INDEPENDENT_AMBULATORY_CARE_PROVIDER_SITE_OTHER): Payer: Federal, State, Local not specified - PPO | Admitting: Neurosurgery

## 2022-11-03 VITALS — BP 148/88 | Ht 69.0 in | Wt 214.0 lb

## 2022-11-03 DIAGNOSIS — M5412 Radiculopathy, cervical region: Secondary | ICD-10-CM

## 2022-11-03 DIAGNOSIS — Z09 Encounter for follow-up examination after completed treatment for conditions other than malignant neoplasm: Secondary | ICD-10-CM

## 2022-11-03 DIAGNOSIS — M50122 Cervical disc disorder at C5-C6 level with radiculopathy: Secondary | ICD-10-CM | POA: Diagnosis not present

## 2022-11-03 NOTE — Progress Notes (Signed)
   REFERRING PHYSICIAN:  Rosario Adie [address] [place],  [place] [address]  DOS: 05/11/22 C6-7 arthroplasty  HISTORY OF PRESENT ILLNESS: Gary Gray is status post cervical arthroplasty.   He is doing extremely well.   PHYSICAL EXAMINATION:  NEUROLOGICAL:  General: In no acute distress.   Awake, alert, oriented to person, place, and time.  Pupils equal round and reactive to light.  Facial tone is symmetric.   Strength: Side Biceps Triceps Deltoid Interossei Grip Wrist Ext. Wrist Flex.  R 5 5 5 5 5 5 5   L 5 5 5 5 5 5 5    Incision well healed   Imaging:  Cervical xrays dated 08/02/22:  No complications noted.   X-rays today, stable   Assessment / Plan: Gary Gray is doing well s/p cervical arthroplasty.  I think he is doing extremely well.  I will see him back on an as-needed basis.  He is off all restrictions.  I have recommended that he talk with his primary care doctor regarding smoking cessation.  I think he should strongly consider quitting smoking.    Venetia Night MD Dept of Neurosurgery

## 2022-12-06 ENCOUNTER — Ambulatory Visit: Payer: Federal, State, Local not specified - PPO | Admitting: Family Medicine

## 2022-12-06 ENCOUNTER — Encounter: Payer: Self-pay | Admitting: Family Medicine

## 2022-12-06 ENCOUNTER — Telehealth: Payer: Federal, State, Local not specified - PPO | Admitting: Family Medicine

## 2022-12-06 VITALS — BP 125/87 | Ht 69.0 in | Wt 210.0 lb

## 2022-12-06 DIAGNOSIS — F320 Major depressive disorder, single episode, mild: Secondary | ICD-10-CM

## 2022-12-06 MED ORDER — FLUOXETINE HCL 20 MG PO CAPS: 20.0000 mg | ORAL_CAPSULE | Freq: Every day | ORAL | 0 refills | Status: DC

## 2022-12-06 NOTE — Assessment & Plan Note (Signed)
Improved, but not quite there. Will increase his prozac to 20mg  and recheck in about 4-6 weeks. Call with any concerns.

## 2022-12-06 NOTE — Progress Notes (Signed)
BP 125/87 Comment: PT REPORTED  Ht 5\' 9"  (1.753 m)   Wt 210 lb (95.3 kg)   BMI 31.01 kg/m    Subjective:    Patient ID: Gary Gray, male    DOB: 08/03/1975, 47 y.o.   MRN: [id number]  HPI: Gary Gray is a 48 y.o. male  Chief Complaint  Patient presents with   Depression   DEPRESSION Mood status: better Satisfied with current treatment?:  not quite there Symptom severity: mild  Duration of current treatment :  about a month Side effects: no Medication compliance: excellent compliance Psychotherapy/counseling: no  Previous psychiatric medications: prozac Depressed mood: yes Anxious mood: no Anhedonia: no Significant weight loss or gain: no Insomnia: no  Fatigue: yes Feelings of worthlessness or guilt: no Impaired concentration/indecisiveness: no Suicidal ideations: no Hopelessness: no Crying spells: no    10/17/2022    2:28 PM 08/15/2022    2:50 PM 06/06/2022    8:56 AM 05/02/2022    9:23 AM 05/19/2021    3:38 PM  Depression screen PHQ 2/9  Decreased Interest 1 0 0 0 0  Down, Depressed, Hopeless 1 1 1  0 0  PHQ - 2 Score 2 1 1  0 0  Altered sleeping 0 0 0 0 0  Tired, decreased energy 1 0 0 0 0  Change in appetite 0 0 0 0 0  Feeling bad or failure about yourself  1 0 0 0 0  Trouble concentrating 0 0 0 0 0  Moving slowly or fidgety/restless 0 0 0 0 0  Suicidal thoughts 0 0 0 0 0  PHQ-9 Score 4 1 1  0 0  Difficult doing work/chores Not difficult at all Not difficult at all Not difficult at all Not difficult at all Not difficult at all      10/17/2022    2:28 PM 08/15/2022    2:50 PM 06/06/2022    8:56 AM 05/02/2022    9:24 AM  GAD 7 : Generalized Anxiety Score  Nervous, Anxious, on Edge 0 0 0 0  Control/stop worrying 1 0 0 0  Worry too much - different things 1 0 0 0  Trouble relaxing 0 0 1 0  Restless 1 0 0 0  Easily annoyed or irritable 1 0 1 0  Afraid - awful might happen 0 0 0 0  Total GAD 7 Score 4 0 2 0  Anxiety Difficulty Not difficult at all Not  difficult at all Not difficult at all Not difficult at all      Relevant past medical, surgical, family and social history reviewed and updated as indicated. Interim medical history since our last visit reviewed. Allergies and medications reviewed and updated.  Review of Systems  Constitutional: Negative.   Respiratory: Negative.    Cardiovascular: Negative.   Gastrointestinal: Negative.   Musculoskeletal: Negative.   Neurological: Negative.   Psychiatric/Behavioral: Negative.      Per HPI unless specifically indicated above     Objective:    BP 125/87 Comment: PT REPORTED  Ht 5\' 9"  (1.753 m)   Wt 210 lb (95.3 kg)   BMI 31.01 kg/m   Wt Readings from Last 3 Encounters:  12/06/22 210 lb (95.3 kg)  11/03/22 214 lb (97.1 kg)  10/17/22 214 lb (97.1 kg)    Physical Exam Constitutional:      General: He is not in acute distress.    Appearance: Normal appearance. He is well-developed. He is not ill-appearing, toxic-appearing or diaphoretic.  HENT:  Head: Normocephalic and atraumatic.     Right Ear: Hearing and external ear normal.     Left Ear: Hearing and external ear normal.     Nose: Nose normal.  Eyes:     General: Lids are normal. No scleral icterus.       Right eye: No discharge.        Left eye: No discharge.     Extraocular Movements: Extraocular movements intact.     Conjunctiva/sclera: Conjunctivae normal.     Pupils: Pupils are equal, round, and reactive to light.  Pulmonary:     Effort: Pulmonary effort is normal. No respiratory distress.  Musculoskeletal:        General: Normal range of motion.     Cervical back: Normal range of motion.  Skin:    Coloration: Skin is not jaundiced or pale.     Findings: No bruising, erythema, lesion or rash.  Neurological:     General: No focal deficit present.     Mental Status: He is alert and oriented to person, place, and time.  Psychiatric:        Mood and Affect: Mood normal.        Speech: Speech normal.         Behavior: Behavior normal.        Thought Content: Thought content normal.        Judgment: Judgment normal.     Results for orders placed or performed in visit on 10/17/22  Basic metabolic panel  Result Value Ref Range   Glucose 80 70 - 99 mg/dL   BUN 10 6 - 24 mg/dL   Creatinine, Ser 2.44 0.76 - 1.27 mg/dL   eGFR 010 >27 OZ/DGU/4.40   BUN/Creatinine Ratio 11 9 - 20   Sodium 136 134 - 144 mmol/L   Potassium 4.0 3.5 - 5.2 mmol/L   Chloride 98 96 - 106 mmol/L   CO2 23 20 - 29 mmol/L   Calcium 10.0 8.7 - 10.2 mg/dL      Assessment & Plan:   Problem List Items Addressed This Visit       Other   Depression, major, single episode, mild (HCC) - Primary    Improved, but not quite there. Will increase his prozac to 20mg  and recheck in about 4-6 weeks. Call with any concerns.       Relevant Medications   FLUoxetine (PROZAC) 20 MG capsule     Follow up plan: Return 4-6 weeks.   This visit was completed via video visit through MyChart due to the restrictions of the COVID-19 pandemic. All issues as above were discussed and addressed. Physical exam was done as above through visual confirmation on video through MyChart. If it was felt that the patient should be evaluated in the office, they were directed there. The patient verbally consented to this visit. Location of the patient: home Location of the provider: work Those involved with this call:  Provider: Olevia Perches, DO CMA:  Dorna Mai, CMA Front Desk/Registration: Servando Snare  Time spent on call:  15 minutes with patient face to face via video conference. More than 50% of this time was spent in counseling and coordination of care. 23 minutes total spent in review of patient's record and preparation of their chart.

## 2022-12-06 NOTE — Progress Notes (Signed)
An appointment has been made.

## 2023-01-17 ENCOUNTER — Telehealth (INDEPENDENT_AMBULATORY_CARE_PROVIDER_SITE_OTHER): Payer: Federal, State, Local not specified - PPO | Admitting: Family Medicine

## 2023-01-17 ENCOUNTER — Encounter: Payer: Self-pay | Admitting: Family Medicine

## 2023-01-17 DIAGNOSIS — F320 Major depressive disorder, single episode, mild: Secondary | ICD-10-CM

## 2023-01-17 DIAGNOSIS — Z1211 Encounter for screening for malignant neoplasm of colon: Secondary | ICD-10-CM

## 2023-01-17 MED ORDER — LISINOPRIL 20 MG PO TABS: 20.0000 mg | ORAL_TABLET | Freq: Every day | ORAL | 0 refills | Status: DC

## 2023-01-17 MED ORDER — FLUOXETINE HCL 20 MG PO CAPS: 20.0000 mg | ORAL_CAPSULE | Freq: Every day | ORAL | 1 refills | Status: DC

## 2023-01-17 NOTE — Progress Notes (Signed)
Scheduled appointment on 05/04/2023 @ 2:00 pm for physical.

## 2023-01-17 NOTE — Assessment & Plan Note (Signed)
Under good control on current regimen. Continue current regimen. Continue to monitor. Call with any concerns. Refills given.   

## 2023-01-17 NOTE — Progress Notes (Signed)
There were no vitals taken for this visit.   Subjective:    Patient ID: Gary Gray, male    DOB: [birth year], 47 y.o.   MRN: [id number]  HPI: Gary Gray is a 47 y.o. male  Chief Complaint  Patient presents with   Mood    Patient says he feeling like everything is going great and the medication is working well.    DEPRESSION Mood status: better Satisfied with current treatment?: yes Symptom severity: mild  Duration of current treatment : months Side effects: no Medication compliance: excellent compliance Psychotherapy/counseling: no  Previous psychiatric medications: prozac Depressed mood: no Anxious mood: no Anhedonia: no Significant weight loss or gain: no Insomnia: no  Fatigue: no Feelings of worthlessness or guilt: no Impaired concentration/indecisiveness: no Suicidal ideations: no Hopelessness: no Crying spells: no    01/17/2023    2:05 PM 10/17/2022    2:28 PM 08/15/2022    2:50 PM 06/06/2022    8:56 AM 05/02/2022    9:23 AM  Depression screen PHQ 2/9  Decreased Interest 0 1 0 0 0  Down, Depressed, Hopeless 0 1 1 1  0  PHQ - 2 Score 0 2 1 1  0  Altered sleeping 0 0 0 0 0  Tired, decreased energy 0 1 0 0 0  Change in appetite 0 0 0 0 0  Feeling bad or failure about yourself  0 1 0 0 0  Trouble concentrating 0 0 0 0 0  Moving slowly or fidgety/restless 0 0 0 0 0  Suicidal thoughts 0 0 0 0 0  PHQ-9 Score 0 4 1 1  0  Difficult doing work/chores Not difficult at all Not difficult at all Not difficult at all Not difficult at all Not difficult at all    Relevant past medical, surgical, family and social history reviewed and updated as indicated. Interim medical history since our last visit reviewed. Allergies and medications reviewed and updated.  Review of Systems  Constitutional: Negative.   Respiratory: Negative.    Cardiovascular: Negative.   Gastrointestinal: Negative.   Musculoskeletal: Negative.   Psychiatric/Behavioral: Negative.      Per HPI  unless specifically indicated above     Objective:    There were no vitals taken for this visit.  Wt Readings from Last 3 Encounters:  12/06/22 210 lb (95.3 kg)  11/03/22 214 lb (97.1 kg)  10/17/22 214 lb (97.1 kg)    Physical Exam Vitals and nursing note reviewed.  Constitutional:      General: He is not in acute distress.    Appearance: Normal appearance. He is not ill-appearing, toxic-appearing or diaphoretic.  HENT:     Head: Normocephalic and atraumatic.     Right Ear: External ear normal.     Left Ear: External ear normal.     Nose: Nose normal.     Mouth/Throat:     Mouth: Mucous membranes are moist.     Pharynx: Oropharynx is clear.  Eyes:     General: No scleral icterus.       Right eye: No discharge.        Left eye: No discharge.     Conjunctiva/sclera: Conjunctivae normal.     Pupils: Pupils are equal, round, and reactive to light.  Pulmonary:     Effort: Pulmonary effort is normal. No respiratory distress.     Comments: Speaking in full sentences Musculoskeletal:        General: Normal range of motion.  Cervical back: Normal range of motion.  Skin:    Coloration: Skin is not jaundiced or pale.     Findings: No bruising, erythema, lesion or rash.  Neurological:     Mental Status: He is alert and oriented to person, place, and time. Mental status is at baseline.  Psychiatric:        Mood and Affect: Mood normal.        Behavior: Behavior normal.        Thought Content: Thought content normal.        Judgment: Judgment normal.     Results for orders placed or performed in visit on 10/17/22  Basic metabolic panel  Result Value Ref Range   Glucose 80 70 - 99 mg/dL   BUN 10 6 - 24 mg/dL   Creatinine, Ser 9.52 0.76 - 1.27 mg/dL   eGFR 841 >32 GM/WNU/2.72   BUN/Creatinine Ratio 11 9 - 20   Sodium 136 134 - 144 mmol/L   Potassium 4.0 3.5 - 5.2 mmol/L   Chloride 98 96 - 106 mmol/L   CO2 23 20 - 29 mmol/L   Calcium 10.0 8.7 - 10.2 mg/dL       Assessment & Plan:   Problem List Items Addressed This Visit       Other   Depression, major, single episode, mild (HCC) - Primary    Under good control on current regimen. Continue current regimen. Continue to monitor. Call with any concerns. Refills given.        Relevant Medications   FLUoxetine (PROZAC) 20 MG capsule   Other Visit Diagnoses     Screening for colon cancer       Referral to GI placed today.   Relevant Orders   Ambulatory referral to Gastroenterology        Follow up plan: Return After 05/03/23 for physical.    This visit was completed via video visit through MyChart due to the restrictions of the COVID-19 pandemic. All issues as above were discussed and addressed. Physical exam was done as above through visual confirmation on video through MyChart. If it was felt that the patient should be evaluated in the office, they were directed there. The patient verbally consented to this visit. Location of the patient: home Location of the provider: work Those involved with this call:  Provider: Olevia Perches, DO CMA: Malen Gauze, CMA Front Desk/Registration:  Servando Snare   Time spent on call:  15 minutes with patient face to face via video conference. More than 50% of this time was spent in counseling and coordination of care. 23 minutes total spent in review of patient's record and preparation of their chart.

## 2023-01-18 ENCOUNTER — Other Ambulatory Visit: Payer: Self-pay

## 2023-01-18 ENCOUNTER — Telehealth: Payer: Self-pay

## 2023-01-18 DIAGNOSIS — Z1211 Encounter for screening for malignant neoplasm of colon: Secondary | ICD-10-CM

## 2023-01-18 NOTE — Telephone Encounter (Signed)
Gastroenterology Pre-Procedure Review  Request Date: 03/20/23 Requesting Physician: Dr. Tobi Bastos  PATIENT REVIEW QUESTIONS: The patient responded to the following health history questions as indicated:    1. Are you having any GI issues? no 2. Do you have a personal history of Polyps? no 3. Do you have a family history of Colon Cancer or Polyps? yes (father colon polyps) 4. Diabetes Mellitus? no 5. Joint replacements in the past 12 months?no 6. Major health problems in the past 3 months?no 7. Any artificial heart valves, MVP, or defibrillator?no    MEDICATIONS & ALLERGIES:    Patient reports the following regarding taking any anticoagulation/antiplatelet therapy:   Plavix, Coumadin, Eliquis, Xarelto, Lovenox, Pradaxa, Brilinta, or Effient? no Aspirin? no  Patient confirms/reports the following medications:  Current Outpatient Medications  Medication Sig Dispense Refill   acetaminophen (TYLENOL) 500 MG tablet Take 2 tablets (1,000 mg total) by mouth every 6 (six) hours as needed for mild pain (or Fever >/= 101).     FLUoxetine (PROZAC) 20 MG capsule Take 1 capsule (20 mg total) by mouth daily. 90 capsule 1   lisinopril (ZESTRIL) 20 MG tablet Take 1 tablet (20 mg total) by mouth daily. 90 tablet 0   naproxen (NAPROSYN) 500 MG tablet Take 1 tablet (500 mg total) by mouth 2 (two) times daily with a meal. 60 tablet 0   omeprazole (PRILOSEC) 20 MG capsule Take 20 mg by mouth as needed.     No current facility-administered medications for this visit.    Patient confirms/reports the following allergies:  Allergies  Allergen Reactions   Neurontin [Gabapentin]     aphasia    No orders of the defined types were placed in this encounter.   AUTHORIZATION INFORMATION Primary Insurance: 1D#: Group #:  Secondary Insurance: 1D#: Group #:  SCHEDULE INFORMATION: Date: 03/20/23 Time: Location: [facility]

## 2023-03-10 ENCOUNTER — Other Ambulatory Visit: Payer: Self-pay | Admitting: Family Medicine

## 2023-03-17 ENCOUNTER — Encounter: Payer: Self-pay | Admitting: Gastroenterology

## 2023-03-17 ENCOUNTER — Telehealth: Payer: Self-pay

## 2023-03-17 ENCOUNTER — Other Ambulatory Visit: Payer: Self-pay

## 2023-03-17 ENCOUNTER — Telehealth: Payer: Self-pay | Admitting: Family Medicine

## 2023-03-17 ENCOUNTER — Ambulatory Visit: Payer: Self-pay | Admitting: *Deleted

## 2023-03-17 MED ORDER — NA SULFATE-K SULFATE-MG SULF 17.5-3.13-1.6 GM/177ML PO SOLN: 354.0000 mL | Freq: Once | ORAL | 0 refills | Status: AC

## 2023-03-17 MED ORDER — NA SULFATE-K SULFATE-MG SULF 17.5-3.13-1.6 GM/177ML PO SOLN: 1.0000 | Freq: Once | ORAL | 0 refills | Status: DC

## 2023-03-17 NOTE — Telephone Encounter (Signed)
 Need bowel prep medication ordered for colonoscopy scheduled on Monday. Dr Tobi Bastos stated to patient to get this from primary care provider.

## 2023-03-17 NOTE — Telephone Encounter (Signed)
 No contact with patient . Request for pre op bowel prep for colonoscopy  Chief Complaint: patient called agent , Gary Gray requesting to notify PCP to order pre procedure bowel prep for up coming colonoscopy. Patient was told by office of Dr. Therisa to call PCP.  Symptoms: na Frequency: na Pertinent Negatives: Patient denies na Disposition: [] ED /[] Urgent Care (no appt availability in office) / [] Appointment(In office/virtual)/ []  [facility]/ [] Home Care/ [] Refused Recommended Disposition /[] [facility]/ [x]  Follow-up with PCP Additional Notes:    Please advise .        Reason for Disposition  [1] Caller has URGENT medicine question about med that PCP or specialist prescribed AND [2] triager unable to answer question  Answer Assessment - Initial Assessment Questions 1. NAME of MEDICINE: What medicine(s) are you calling about?     Pre procedure bowel prep for colonscopy 2. QUESTION: What is your question? (e.g., double dose of medicine, side effect)     Told to call PCP to order bowel prep medication for procedure on 03/19/22. 3. PRESCRIBER: Who prescribed the medicine? Reason: if prescribed by specialist, call should be referred to that group.     Na  4. SYMPTOMS: Do you have any symptoms? If Yes, ask: What symptoms are you having?  How bad are the symptoms (e.g., mild, moderate, severe)     Na  5. PREGNANCY:  Is there any chance that you are pregnant? When was your last menstrual period?     na  Protocols used: Medication Question Call-A-AH

## 2023-03-17 NOTE — Telephone Encounter (Signed)
 Per chart- Rx has been sent to pharmacy- message left on VM per DPR permission

## 2023-03-17 NOTE — Telephone Encounter (Signed)
 Transferred call to [facility]

## 2023-03-17 NOTE — Telephone Encounter (Signed)
 Called and notified patient that medication has been sent in this morning by Laurette Schimke.

## 2023-03-20 ENCOUNTER — Ambulatory Visit
Admission: RE | Admit: 2023-03-20 | Discharge: 2023-03-20 | Disposition: A | Discharge status: Home / Self Care | Payer: BC Managed Care – PPO | Attending: Gastroenterology | Admitting: Gastroenterology

## 2023-03-20 ENCOUNTER — Encounter: Payer: Self-pay | Admitting: Gastroenterology

## 2023-03-20 ENCOUNTER — Ambulatory Visit: Payer: BC Managed Care – PPO | Admitting: Anesthesiology

## 2023-03-20 ENCOUNTER — Encounter
Admission: RE | Disposition: A | Discharge status: Home / Self Care | Payer: Self-pay | Source: Home / Self Care | Attending: Gastroenterology

## 2023-03-20 DIAGNOSIS — D125 Benign neoplasm of sigmoid colon: Secondary | ICD-10-CM

## 2023-03-20 DIAGNOSIS — Z79899 Other long term (current) drug therapy: Secondary | ICD-10-CM | POA: Diagnosis not present

## 2023-03-20 DIAGNOSIS — K635 Polyp of colon: Secondary | ICD-10-CM | POA: Diagnosis not present

## 2023-03-20 DIAGNOSIS — K5730 Diverticulosis of large intestine without perforation or abscess without bleeding: Secondary | ICD-10-CM

## 2023-03-20 DIAGNOSIS — Z1211 Encounter for screening for malignant neoplasm of colon: Secondary | ICD-10-CM | POA: Diagnosis not present

## 2023-03-20 DIAGNOSIS — F17210 Nicotine dependence, cigarettes, uncomplicated: Secondary | ICD-10-CM | POA: Insufficient documentation

## 2023-03-20 DIAGNOSIS — J45909 Unspecified asthma, uncomplicated: Secondary | ICD-10-CM | POA: Insufficient documentation

## 2023-03-20 DIAGNOSIS — D122 Benign neoplasm of ascending colon: Secondary | ICD-10-CM | POA: Diagnosis not present

## 2023-03-20 DIAGNOSIS — I10 Essential (primary) hypertension: Secondary | ICD-10-CM | POA: Diagnosis not present

## 2023-03-20 HISTORY — PX: COLONOSCOPY WITH PROPOFOL: SHX5780

## 2023-03-20 HISTORY — PX: POLYPECTOMY: SHX5525

## 2023-03-20 SURGERY — COLONOSCOPY WITH PROPOFOL
Anesthesia: General

## 2023-03-20 MED ORDER — PROPOFOL 500 MG/50ML IV EMUL
INTRAVENOUS | Status: DC | PRN
  Administered 2023-03-20: 200 ug/kg/min via INTRAVENOUS
  Administered 2023-03-20: 100 mg via INTRAVENOUS

## 2023-03-20 MED ORDER — SODIUM CHLORIDE 0.9 % IV SOLN
INTRAVENOUS | Status: DC
  Administered 2023-03-20: 20 mL/h via INTRAVENOUS

## 2023-03-20 NOTE — H&P (Signed)
 Ruel Kung, MD [address], [address], [place], [place], [address] [address], [place], [place], [address] Phone: [phone]  Fax: [phone]  Primary Care Physician:  Vicci Duwaine SQUIBB, DO   Pre-Procedure History & Physical: HPI:  Gary Gray is a 48 y.o. male is here for an colonoscopy.   Past Medical History:  Diagnosis Date   Arthritis    Asthma    Hypertension     Past Surgical History:  Procedure Laterality Date   CERVICAL DISC ARTHROPLASTY N/A 05/11/2022   Procedure: C6-7 ARTHROPLASTY;  Surgeon: Clois Fret, MD;  Location: [facility] ORS;  Service: Neurosurgery;  Laterality: N/A;    Prior to Admission medications   Medication Sig Start Date End Date Taking? Authorizing Provider  acetaminophen  (TYLENOL ) 500 MG tablet Take 2 tablets (1,000 mg total) by mouth every 6 (six) hours as needed for mild pain (or Fever >/= 101). 02/23/22  Yes Sebastian Toribio GAILS, MD  FLUoxetine  (PROZAC ) 20 MG capsule Take 1 capsule (20 mg total) by mouth daily. 01/17/23  Yes Johnson, Megan P, DO  lisinopril  (ZESTRIL ) 20 MG tablet Take 1 tablet (20 mg total) by mouth daily. 01/17/23  Yes Johnson, Megan P, DO  naproxen  (NAPROSYN ) 500 MG tablet Take 1 tablet (500 mg total) by mouth 2 (two) times daily with a meal. 05/11/22  Yes Gregory Edsel Ruth, PA  omeprazole  (PRILOSEC) 20 MG capsule Take 20 mg by mouth as needed.   Yes [provider]    Allergies as of 01/18/2023 - Review Complete 01/17/2023  Allergen Reaction Noted   Neurontin  [gabapentin ]  03/11/2022    Family History  Problem Relation Age of Onset   Diabetes Mother    Heart disease Father    Atrial fibrillation Father    Diabetes Maternal Grandfather    Breast cancer Paternal Grandmother     Social History   Socioeconomic History   Marital status: Married    Spouse name: Research Scientist (physical Sciences)   Number of children: 3   Years of education: Not on file   Highest education level: Not on file  Occupational History    Not on file  Tobacco Use   Smoking status: Every Day    Current packs/day: 1.50    Types: Cigarettes    Passive exposure: Current   Smokeless tobacco: Never  Vaping Use   Vaping status: Never Used  Substance and Sexual Activity   Alcohol use: Yes    Alcohol/week: 3.0 standard drinks of alcohol    Types: 3 Cans of beer per week    Comment: per night   Drug use: No   Sexual activity: Yes  Other Topics Concern   Not on file  Social History Narrative   Not on file   Social Drivers of Health   Financial Resource Strain: Not on file  Food Insecurity: No Food Insecurity (02/23/2022)   Hunger Vital Sign    Worried About Running Out of Food in the Last Year: Never true    Ran Out of Food in the Last Year: Never true  Transportation Needs: No Transportation Needs (02/23/2022)   PRAPARE - Administrator, Civil Service (Medical): No    Lack of Transportation (Non-Medical): No  Physical Activity: Not on file  Stress: Not on file  Social Connections: Not on file  Intimate Partner Violence: Not At Risk (02/23/2022)   Humiliation, Afraid, Rape, and Kick questionnaire    Fear of Current or Ex-Partner: No  Emotionally Abused: No    Physically Abused: No    Sexually Abused: No    Review of Systems: See HPI, otherwise negative ROS  Physical Exam: There were no vitals taken for this visit. General:   Alert,  pleasant and cooperative in NAD Head:  Normocephalic and atraumatic. Neck:  Supple; no masses or thyromegaly. Lungs:  Clear throughout to auscultation, normal respiratory effort.    Heart:  +S1, +S2, Regular rate and rhythm, No edema. Abdomen:  Soft, nontender and nondistended. Normal bowel sounds, without guarding, and without rebound.   Neurologic:  Alert and  oriented x4;  grossly normal neurologically.  Impression/Plan: Gary Gray is here for an colonoscopy to be performed for Screening colonoscopy average risk   Risks, benefits, limitations, and  alternatives regarding  colonoscopy have been reviewed with the patient.  Questions have been answered.  All parties agreeable.   Ruel Kung, MD  03/20/2023, 7:53 AM

## 2023-03-20 NOTE — Op Note (Signed)
 [facility] Gastroenterology Patient Name: Gary Gray Procedure Date: 03/20/2023 8:11 AM MRN: [id number] Account #: [id number] Date of Birth: 1975/03/16 Admit Type: Outpatient Age: 48 Room: [facility] ENDO ROOM 1 Gender: Male Note Status: Finalized Instrument Name: Arvis [id number] Procedure:             Colonoscopy Indications:           Screening for colorectal malignant neoplasm Providers:             Ruel Kung MD, MD Referring MD:          Duwaine MYRTIS Louder (Referring MD) Medicines:             Propofol  per Anesthesia, Monitored Anesthesia Care Complications:         No immediate complications. Procedure:             Pre-Anesthesia Assessment:                        - Prior to the procedure, a History and Physical was                         performed, and patient medications, allergies and                         sensitivities were reviewed. The patient's tolerance                         of previous anesthesia was reviewed.                        - The risks and benefits of the procedure and the                         sedation options and risks were discussed with the                         patient. All questions were answered and informed                         consent was obtained.                        - ASA Grade Assessment: II - A patient with mild                         systemic disease.                        After obtaining informed consent, the colonoscope was                         passed under direct vision. Throughout the procedure,                         the patient's blood pressure, pulse, and oxygen                         saturations were monitored continuously. The  Colonoscope was introduced through the anus and                         advanced to the the cecum, identified by the                         appendiceal orifice. The colonoscopy was performed                         with ease. The patient tolerated the  procedure well.                         The quality of the bowel preparation was excellent.                         The ileocecal valve, appendiceal orifice, and rectum                         were photographed. Findings:      A 4 mm polyp was found in the ascending colon. The polyp was sessile.       The polyp was removed with a jumbo cold forceps. Resection and retrieval       were complete.      A few small-mouthed diverticula were found in the entire colon.      The exam was otherwise without abnormality on direct and retroflexion       views. Impression:            - One 4 mm polyp in the ascending colon, removed with                         a jumbo cold forceps. Resected and retrieved.                        - Diverticulosis in the entire examined colon.                        - The examination was otherwise normal on direct and                         retroflexion views. Recommendation:        - Discharge patient to home (with escort).                        - Resume previous diet.                        - Continue present medications.                        - Await pathology results.                        - Repeat colonoscopy for surveillance based on                         pathology results. Procedure Code(s):     --- Professional ---  54619, Colonoscopy, flexible; with biopsy, single or                         multiple Diagnosis Code(s):     --- Professional ---                        Z12.11, Encounter for screening for malignant neoplasm                         of colon                        D12.2, Benign neoplasm of ascending colon                        K57.30, Diverticulosis of large intestine without                         perforation or abscess without bleeding CPT copyright 2022 American Medical Association. All rights reserved. The codes documented in this report are preliminary and upon coder review may  be revised to meet current  compliance requirements. Ruel Kung, MD Ruel Kung MD, MD 03/20/2023 8:44:30 AM This report has been signed electronically. Number of Addenda: 0 Note Initiated On: 03/20/2023 8:11 AM Scope Withdrawal Time: 0 hours 10 minutes 11 seconds  Total Procedure Duration: 0 hours 14 minutes 1 second  Estimated Blood Loss:  Estimated blood loss: none.      [facility]

## 2023-03-20 NOTE — Transfer of Care (Signed)
 Immediate Anesthesia Transfer of Care Note  Patient: Gary Gray  Procedure(s) Performed: COLONOSCOPY WITH PROPOFOL  POLYPECTOMY  Patient Location: PACU  Anesthesia Type:General  Level of Consciousness: drowsy  Airway & Oxygen Therapy: Patient Spontanous Breathing  Post-op Assessment: Report given to RN and Post -op Vital signs reviewed and stable  Post vital signs: Reviewed and stable  Last Vitals:  Vitals Value Taken Time  BP 88/68 03/20/23 0846  Temp 36.3 C 03/20/23 0846  Pulse 89 03/20/23 0852  Resp 26 03/20/23 0852  SpO2 98 % 03/20/23 0852  Vitals shown include unfiled device data.  Last Pain:  Vitals:   03/20/23 0846  TempSrc: Temporal  PainSc: Asleep         Complications: There were no known notable events for this encounter.

## 2023-03-20 NOTE — Anesthesia Preprocedure Evaluation (Signed)
 Anesthesia Evaluation  Patient identified by MRN, date of birth, ID band Patient awake    Reviewed: Allergy & Precautions, H&P , NPO status , Patient's Chart, lab work & pertinent test results, reviewed documented beta blocker date and time   Airway Mallampati: II   Neck ROM: full    Dental  (+) Poor Dentition   Pulmonary asthma , Current Smoker   Pulmonary exam normal        Cardiovascular Exercise Tolerance: Good hypertension, On Medications negative cardio ROS Normal cardiovascular exam Rhythm:regular Rate:Normal     Neuro/Psych    Depression     Neuromuscular disease  negative psych ROS   GI/Hepatic negative GI ROS, Neg liver ROS,,,  Endo/Other  negative endocrine ROS    Renal/GU negative Renal ROS  negative genitourinary   Musculoskeletal   Abdominal   Peds  Hematology negative hematology ROS (+)   Anesthesia Other Findings Past Medical History: No date: Arthritis No date: Asthma No date: Hypertension Past Surgical History: 05/11/2022: CERVICAL DISC ARTHROPLASTY; N/A     Comment:  Procedure: C6-7 ARTHROPLASTY;  Surgeon: Clois Fret, MD;  Location: [facility] ORS;  Service: Neurosurgery;              Laterality: N/A; BMI    Body Mass Index: 41.00 kg/m     Reproductive/Obstetrics negative OB ROS                             Anesthesia Physical Anesthesia Plan  ASA: 2  Anesthesia Plan: General   Post-op Pain Management:    Induction:   PONV Risk Score and Plan:   Airway Management Planned:   Additional Equipment:   Intra-op Plan:   Post-operative Plan:   Informed Consent: I have reviewed the patients History and Physical, chart, labs and discussed the procedure including the risks, benefits and alternatives for the proposed anesthesia with the patient or authorized representative who has indicated his/her understanding and acceptance.     Dental  Advisory Given  Plan Discussed with: CRNA  Anesthesia Plan Comments:        Anesthesia Quick Evaluation

## 2023-03-21 LAB — SURGICAL PATHOLOGY

## 2023-03-22 ENCOUNTER — Encounter: Payer: Self-pay | Admitting: Gastroenterology

## 2023-03-22 NOTE — Anesthesia Postprocedure Evaluation (Signed)
 Anesthesia Post Note  Patient: Gary Gray  Procedure(s) Performed: COLONOSCOPY WITH PROPOFOL  POLYPECTOMY  Patient location during evaluation: PACU Anesthesia Type: General Level of consciousness: awake and alert Pain management: pain level controlled Vital Signs Assessment: post-procedure vital signs reviewed and stable Respiratory status: spontaneous breathing, nonlabored ventilation, respiratory function stable and patient connected to nasal cannula oxygen Cardiovascular status: blood pressure returned to baseline and stable Postop Assessment: no apparent nausea or vomiting Anesthetic complications: no   There were no known notable events for this encounter.   Last Vitals:  Vitals:   03/20/23 0848 03/20/23 0856  BP:  110/81  Pulse: 92 77  Resp: 19 15  Temp:    SpO2: 98% 98%    Last Pain:  Vitals:   03/21/23 0746  TempSrc:   PainSc: 0-No pain                 Zula Hitch

## 2023-05-04 ENCOUNTER — Encounter: Payer: Self-pay | Admitting: Family Medicine

## 2023-05-04 ENCOUNTER — Ambulatory Visit (INDEPENDENT_AMBULATORY_CARE_PROVIDER_SITE_OTHER): Payer: Federal, State, Local not specified - PPO | Admitting: Family Medicine

## 2023-05-04 VITALS — BP 109/73 | HR 71 | Temp 99.1°F | Resp 15 | Ht 69.69 in | Wt 202.0 lb

## 2023-05-04 DIAGNOSIS — Z0000 Encounter for general adult medical examination without abnormal findings: Secondary | ICD-10-CM

## 2023-05-04 DIAGNOSIS — F320 Major depressive disorder, single episode, mild: Secondary | ICD-10-CM | POA: Diagnosis not present

## 2023-05-04 DIAGNOSIS — I10 Essential (primary) hypertension: Secondary | ICD-10-CM | POA: Diagnosis not present

## 2023-05-04 LAB — MICROALBUMIN, URINE WAIVED
Creatinine, Urine Waived: 50 mg/dL (ref 10–300)
Microalb, Ur Waived: 10 mg/L (ref 0–19)
Microalb/Creat Ratio: <30 mg/g (ref <30)

## 2023-05-04 MED ORDER — LISINOPRIL 20 MG PO TABS: 20.0000 mg | ORAL_TABLET | Freq: Every day | ORAL | 1 refills | Status: DC

## 2023-05-04 MED ORDER — FLUOXETINE HCL 20 MG PO CAPS: 20.0000 mg | ORAL_CAPSULE | Freq: Every day | ORAL | 1 refills | Status: DC

## 2023-05-04 MED ORDER — NAPROXEN 500 MG PO TABS: 500.0000 mg | ORAL_TABLET | Freq: Two times a day (BID) | ORAL | 1 refills | Status: DC

## 2023-05-04 NOTE — Progress Notes (Signed)
 BP 109/73 (BP Location: Left Arm, Patient Position: Sitting, Cuff Size: Large)   Pulse 71   Temp 99.1 F (37.3 C) (Oral)   Resp 15   Ht 5' 9.69" (1.77 m)   Wt 202 lb (91.6 kg)   SpO2 97%   BMI 29.25 kg/m    Subjective:    Patient ID: Gary Gray, male    DOB: 25-Oct-1975, 48 y.o.   MRN: [id number]  HPI: Gary Gray is a 48 y.o. male presenting on 05/04/2023 for comprehensive medical examination. Current medical complaints include:  HYPERTENSION  Hypertension status: controlled  Satisfied with current treatment? yes Duration of hypertension: chronic BP monitoring frequency:  not checking BP medication side effects:  no Medication compliance: excellent compliance Previous BP meds:lisinopril Aspirin: no Recurrent headaches: no Visual changes: no Palpitations: no Dyspnea: no Chest pain: no Lower extremity edema: no Dizzy/lightheaded: no  DEPRESSION Mood status: controlled Satisfied with current treatment?: yes Symptom severity: mild  Duration of current treatment : chronic Side effects: no Medication compliance: excellent compliance Psychotherapy/counseling: no  Previous psychiatric medications: prozac Depressed mood: no Anxious mood: no Anhedonia: no Significant weight loss or gain: no Insomnia: no  Fatigue: no Feelings of worthlessness or guilt: no Impaired concentration/indecisiveness: no Suicidal ideations: no Hopelessness: no Crying spells: no    05/04/2023    1:49 PM 01/17/2023    2:05 PM 10/17/2022    2:28 PM 08/15/2022    2:50 PM 06/06/2022    8:56 AM  Depression screen PHQ 2/9  Decreased Interest 0 0 1 0 0  Down, Depressed, Hopeless 0 0 1 1 1   PHQ - 2 Score 0 0 2 1 1   Altered sleeping 0 0 0 0 0  Tired, decreased energy 0 0 1 0 0  Change in appetite 0 0 0 0 0  Feeling bad or failure about yourself  0 0 1 0 0  Trouble concentrating 0 0 0 0 0  Moving slowly or fidgety/restless 0 0 0 0 0  Suicidal thoughts 0 0 0 0 0  PHQ-9 Score 0 0 4 1 1    Difficult doing work/chores  Not difficult at all Not difficult at all Not difficult at all Not difficult at all   He currently lives with: wife Interim Problems from his last visit: no  Depression Screen done today and results listed below:     05/04/2023    1:49 PM 01/17/2023    2:05 PM 10/17/2022    2:28 PM 08/15/2022    2:50 PM 06/06/2022    8:56 AM  Depression screen PHQ 2/9  Decreased Interest 0 0 1 0 0  Down, Depressed, Hopeless 0 0 1 1 1   PHQ - 2 Score 0 0 2 1 1   Altered sleeping 0 0 0 0 0  Tired, decreased energy 0 0 1 0 0  Change in appetite 0 0 0 0 0  Feeling bad or failure about yourself  0 0 1 0 0  Trouble concentrating 0 0 0 0 0  Moving slowly or fidgety/restless 0 0 0 0 0  Suicidal thoughts 0 0 0 0 0  PHQ-9 Score 0 0 4 1 1   Difficult doing work/chores  Not difficult at all Not difficult at all Not difficult at all Not difficult at all     Past Medical History:  Past Medical History:  Diagnosis Date   Arthritis    Asthma    Hypertension     Surgical History:  Past Surgical  History:  Procedure Laterality Date   CERVICAL DISC ARTHROPLASTY N/A 05/11/2022   Procedure: C6-7 ARTHROPLASTY;  Surgeon: Venetia Night, MD;  Location: [facility] ORS;  Service: Neurosurgery;  Laterality: N/A;   COLONOSCOPY WITH PROPOFOL N/A 03/20/2023   Procedure: COLONOSCOPY WITH PROPOFOL;  Surgeon: Wyline Mood, MD;  Location: [facility] ENDOSCOPY;  Service: Gastroenterology;  Laterality: N/A;   POLYPECTOMY  03/20/2023   Procedure: POLYPECTOMY;  Surgeon: Wyline Mood, MD;  Location: [facility] ENDOSCOPY;  Service: Gastroenterology;;    Medications:  Current Outpatient Medications on File Prior to Visit  Medication Sig   acetaminophen (TYLENOL) 500 MG tablet Take 2 tablets (1,000 mg total) by mouth every 6 (six) hours as needed for mild pain (or Fever >/= 101).   omeprazole (PRILOSEC) 20 MG capsule Take 20 mg by mouth as needed.   No current facility-administered medications on file prior to visit.     Allergies:  Allergies  Allergen Reactions   Neurontin [Gabapentin]     aphasia    Social History:  Social History   Socioeconomic History   Marital status: Married    Spouse name: Research scientist (physical sciences)   Number of children: 3   Years of education: Not on file   Highest education level: Some college, no degree  Occupational History   Not on file  Tobacco Use   Smoking status: Every Day    Current packs/day: 1.50    Average packs/day: 1.5 packs/day for 28.2 years (42.2 ttl pk-yrs)    Types: Cigarettes    Start date: 03/08/1995    Passive exposure: Current   Smokeless tobacco: Never  Vaping Use   Vaping status: Never Used  Substance and Sexual Activity   Alcohol use: Yes    Alcohol/week: 3.0 standard drinks of alcohol    Types: 3 Cans of beer per week    Comment: per night   Drug use: Never   Sexual activity: Yes  Other Topics Concern   Not on file  Social History Narrative   Not on file   Social Drivers of Health   Financial Resource Strain: Low Risk  (05/04/2023)   Overall Financial Resource Strain (CARDIA)    Difficulty of Paying Living Expenses: Not hard at all  Food Insecurity: No Food Insecurity (02/23/2022)   Hunger Vital Sign    Worried About Running Out of Food in the Last Year: Never true    Ran Out of Food in the Last Year: Never true  Transportation Needs: No Transportation Needs (02/23/2022)   PRAPARE - Administrator, Civil Service (Medical): No    Lack of Transportation (Non-Medical): No  Physical Activity: Insufficiently Active (05/04/2023)   Exercise Vital Sign    Days of Exercise per Week: 1 day    Minutes of Exercise per Session: 60 min  Stress: No Stress Concern Present (05/04/2023)   [employer] of Occupational Health - Occupational Stress Questionnaire    Feeling of Stress : Not at all  Social Connections: Moderately Integrated (05/04/2023)   Social Connection and Isolation Panel [NHANES]    Frequency of Communication with Friends  and Family: More than three times a week    Frequency of Social Gatherings with Friends and Family: Once a week    Attends Religious Services: More than 4 times per year    Active Member of [employer] or Organizations: No    Attends Banker Meetings: Never    Marital Status: Married  Catering manager Violence: Not At Risk (02/23/2022)   Humiliation, Afraid,  Rape, and Kick questionnaire    Fear of Current or Ex-Partner: No    Emotionally Abused: No    Physically Abused: No    Sexually Abused: No   Social History   Tobacco Use  Smoking Status Every Day   Current packs/day: 1.50   Average packs/day: 1.5 packs/day for 28.2 years (42.2 ttl pk-yrs)   Types: Cigarettes   Start date: 03/08/1995   Passive exposure: Current  Smokeless Tobacco Never   Social History   Substance and Sexual Activity  Alcohol Use Yes   Alcohol/week: 3.0 standard drinks of alcohol   Types: 3 Cans of beer per week   Comment: per night    Family History:  Family History  Problem Relation Age of Onset   Diabetes Mother    Heart disease Father    Atrial fibrillation Father    Diabetes Maternal Grandfather    Breast cancer Paternal Grandmother     Past medical history, surgical history, medications, allergies, family history and social history reviewed with patient today and changes made to appropriate areas of the chart.   Review of Systems  Constitutional: Negative.   HENT: Negative.    Eyes: Negative.   Respiratory: Negative.    Cardiovascular: Negative.   Gastrointestinal: Negative.   Genitourinary: Negative.   Musculoskeletal: Negative.   Skin: Negative.   Neurological: Negative.   Endo/Heme/Allergies: Negative.   Psychiatric/Behavioral: Negative.     All other ROS negative except what is listed above and in the HPI.      Objective:    BP 109/73 (BP Location: Left Arm, Patient Position: Sitting, Cuff Size: Large)   Pulse 71   Temp 99.1 F (37.3 C) (Oral)   Resp 15   Ht 5'  9.69" (1.77 m)   Wt 202 lb (91.6 kg)   SpO2 97%   BMI 29.25 kg/m   Wt Readings from Last 3 Encounters:  05/04/23 202 lb (91.6 kg)  03/20/23 203 lb (92.1 kg)  12/06/22 210 lb (95.3 kg)    Physical Exam Vitals and nursing note reviewed.  Constitutional:      General: He is not in acute distress.    Appearance: Normal appearance. He is not ill-appearing, toxic-appearing or diaphoretic.  HENT:     Head: Normocephalic and atraumatic.     Right Ear: Tympanic membrane, ear canal and external ear normal. There is no impacted cerumen.     Left Ear: Tympanic membrane, ear canal and external ear normal. There is no impacted cerumen.     Nose: Nose normal. No congestion or rhinorrhea.     Mouth/Throat:     Mouth: Mucous membranes are moist.     Pharynx: Oropharynx is clear. No oropharyngeal exudate or posterior oropharyngeal erythema.  Eyes:     General: No scleral icterus.       Right eye: No discharge.        Left eye: No discharge.     Extraocular Movements: Extraocular movements intact.     Conjunctiva/sclera: Conjunctivae normal.     Pupils: Pupils are equal, round, and reactive to light.  Neck:     Vascular: No carotid bruit.  Cardiovascular:     Rate and Rhythm: Normal rate and regular rhythm.     Pulses: Normal pulses.     Heart sounds: No murmur heard.    No friction rub. No gallop.  Pulmonary:     Effort: Pulmonary effort is normal. No respiratory distress.     Breath sounds: Normal breath  sounds. No stridor. No wheezing, rhonchi or rales.  Chest:     Chest wall: No tenderness.  Abdominal:     General: Abdomen is flat. Bowel sounds are normal. There is no distension.     Palpations: Abdomen is soft. There is no mass.     Tenderness: There is no abdominal tenderness. There is no right CVA tenderness, left CVA tenderness, guarding or rebound.     Hernia: No hernia is present.  Genitourinary:    Comments: Genital exam deferred with shared decision making Musculoskeletal:         General: No swelling, tenderness, deformity or signs of injury.     Cervical back: Normal range of motion and neck supple. No rigidity. No muscular tenderness.     Right lower leg: No edema.     Left lower leg: No edema.  Lymphadenopathy:     Cervical: No cervical adenopathy.  Skin:    General: Skin is warm and dry.     Capillary Refill: Capillary refill takes less than 2 seconds.     Coloration: Skin is not jaundiced or pale.     Findings: No bruising, erythema, lesion or rash.  Neurological:     General: No focal deficit present.     Mental Status: He is alert and oriented to person, place, and time.     Cranial Nerves: No cranial nerve deficit.     Sensory: No sensory deficit.     Motor: No weakness.     Coordination: Coordination normal.     Gait: Gait normal.     Deep Tendon Reflexes: Reflexes normal.  Psychiatric:        Mood and Affect: Mood normal.        Behavior: Behavior normal.        Thought Content: Thought content normal.        Judgment: Judgment normal.     Results for orders placed or performed during the hospital encounter of 03/20/23  Surgical pathology   Collection Time: 03/20/23 12:00 AM  Result Value Ref Range   SURGICAL PATHOLOGY      SURGICAL PATHOLOGY [facility] [address] [place], [place] [address] Telephone [phone] or [phone] Fax [phone]  REPORT OF SURGICAL PATHOLOGY   Accession #: [id number] Patient Name: Gary Gray, Gary Gray Visit # : [id number]  MRN: [id number] Physician: Wyline Mood DOB/Age [birth year] (Age: 76) Gender: M Collected Date: 03/20/2023 Received Date: 03/20/2023  FINAL DIAGNOSIS       1. Ascending  Colon Polyp, cbx :       - TUBULAR ADENOMA      - NEGATIVE FOR HIGH-GRADE DYSPLASIA OR MALIGNANCY       DATE SIGNED OUT: 03/21/2023 ELECTRONIC SIGNATURE : Corey Harold M.D., Dossie Arbour., Pathologist, Electronic Signature  MICROSCOPIC DESCRIPTION  CASE COMMENTS STAINS USED  IN DIAGNOSIS: H&E    CLINICAL HISTORY  SPECIMEN(S) OBTAINED 1. Ascending  Colon Polyp, Cbx  SPECIMEN COMMENTS: SPECIMEN CLINICAL INFORMATION: 1. Screening colonoscopy, polyps, diverticulosis    Gross Description 1. Received in formalin is a tan, soft  tissue fragment that is submitted in toto.  Size:  0.4 cm, 1 block submitted.  (KL:kh 03/20/23)        Report signed out from the following location(s) [place] [address], [place] [address] CLIA #: [id number]  [facility] [address] [place], [place] [address] CLIA #: [id number]       Assessment &  Plan:   Problem List Items Addressed This Visit       Cardiovascular and Mediastinum   HTN (hypertension)   Under good control on current regimen. Continue current regimen. Continue to monitor. Call with any concerns. Refills given. Labs drawn today.        Relevant Medications   lisinopril (ZESTRIL) 20 MG tablet   Other Relevant Orders   Microalbumin, Urine Waived     Other   Depression, major, single episode, mild (HCC)   Under good control on current regimen. Continue current regimen. Continue to monitor. Call with any concerns. Refills given. Labs drawn today.       Relevant Medications   FLUoxetine (PROZAC) 20 MG capsule   Other Visit Diagnoses       Routine general medical examination at a health care facility    -  Primary   Vaccines up to date. Screening labs checked today. Colonoscopy up to date. Continue diet and exercise. Call with any concerns.   Relevant Orders   Comprehensive metabolic panel   CBC with Differential/Platelet   Lipid Panel w/o Chol/HDL Ratio   PSA   TSH        LABORATORY TESTING:  Health maintenance labs ordered today as discussed above.   The natural history of prostate cancer and ongoing controversy regarding screening and potential treatment outcomes of prostate cancer has been discussed with the patient. The meaning  of a false positive PSA and a false negative PSA has been discussed. He indicates understanding of the limitations of this screening test and wishes to proceed with screening PSA testing.   IMMUNIZATIONS:   - Tdap: Tetanus vaccination status reviewed: last tetanus booster within 10 years. - Influenza: Refused - Pneumovax: Refused - Prevnar: Refused - COVID: Refused - HPV: Not applicable - Shingrix vaccine: Not applicable  SCREENING: - Colonoscopy: Up to date  Discussed with patient purpose of the colonoscopy is to detect colon cancer at curable precancerous or early stages    PATIENT COUNSELING:    Sexuality: Discussed sexually transmitted diseases, partner selection, use of condoms, avoidance of unintended pregnancy  and contraceptive alternatives.   Advised to avoid cigarette smoking.  I discussed with the patient that most people either abstain from alcohol or drink within safe limits (<=14/week and <=4 drinks/occasion for males, <=7/weeks and <= 3 drinks/occasion for females) and that the risk for alcohol disorders and other health effects rises proportionally with the number of drinks per week and how often a drinker exceeds daily limits.  Discussed cessation/primary prevention of drug use and availability of treatment for abuse.   Diet: Encouraged to adjust caloric intake to maintain  or achieve ideal body weight, to reduce intake of dietary saturated fat and total fat, to limit sodium intake by avoiding high sodium foods and not adding table salt, and to maintain adequate dietary potassium and calcium preferably from fresh fruits, vegetables, and low-fat dairy products.    stressed the importance of regular exercise  Injury prevention: Discussed safety belts, safety helmets, smoke detector, smoking near bedding or upholstery.   Dental health: Discussed importance of regular tooth brushing, flossing, and dental visits.   Follow up plan: NEXT PREVENTATIVE PHYSICAL DUE IN 1  YEAR. Return in about 6 months (around 11/01/2023).

## 2023-05-04 NOTE — Assessment & Plan Note (Signed)
 Under good control on current regimen. Continue current regimen. Continue to monitor. Call with any concerns. Refills given. Labs drawn today.

## 2023-05-05 LAB — CBC WITH DIFFERENTIAL/PLATELET
Basophils Absolute: 0.1 10*3/uL (ref 0.0–0.2)
Basos: 1 %
EOS (ABSOLUTE): 0.2 10*3/uL (ref 0.0–0.4)
Eos: 2 %
Hematocrit: 43.9 % (ref 37.5–51.0)
Hemoglobin: 14.3 g/dL (ref 13.0–17.7)
Immature Grans (Abs): 0 10*3/uL (ref 0.0–0.1)
Immature Granulocytes: 0 %
Lymphocytes Absolute: 2 10*3/uL (ref 0.7–3.1)
Lymphs: 26 %
MCH: 29.4 pg (ref 26.6–33.0)
MCHC: 32.6 g/dL (ref 31.5–35.7)
MCV: 90 fL (ref 79–97)
Monocytes Absolute: 0.4 10*3/uL (ref 0.1–0.9)
Monocytes: 5 %
Neutrophils Absolute: 5 10*3/uL (ref 1.4–7.0)
Neutrophils: 66 %
Platelets: 260 10*3/uL (ref 150–450)
RBC: 4.87 x10E6/uL (ref 4.14–5.80)
RDW: 12.5 % (ref 11.6–15.4)
WBC: 7.6 10*3/uL (ref 3.4–10.8)

## 2023-05-05 LAB — LIPID PANEL W/O CHOL/HDL RATIO
Cholesterol, Total: 185 mg/dL (ref 100–199)
HDL: 29 mg/dL — ABNORMAL LOW (ref >39)
LDL Chol Calc (NIH): 132 mg/dL — ABNORMAL HIGH (ref 0–99)
Triglycerides: 131 mg/dL (ref 0–149)
VLDL Cholesterol Cal: 24 mg/dL (ref 5–40)

## 2023-05-05 LAB — COMPREHENSIVE METABOLIC PANEL
ALT: 15 IU/L (ref 0–44)
AST: 16 IU/L (ref 0–40)
Albumin: 4.6 g/dL (ref 4.1–5.1)
Alkaline Phosphatase: 71 IU/L (ref 44–121)
BUN/Creatinine Ratio: 13 (ref 9–20)
BUN: 10 mg/dL (ref 6–24)
Bilirubin Total: 0.3 mg/dL (ref 0.0–1.2)
CO2: 21 mmol/L (ref 20–29)
Calcium: 9.7 mg/dL (ref 8.7–10.2)
Chloride: 98 mmol/L (ref 96–106)
Creatinine, Ser: 0.8 mg/dL (ref 0.76–1.27)
Globulin, Total: 2.3 g/dL (ref 1.5–4.5)
Glucose: 96 mg/dL (ref 70–99)
Potassium: 4.2 mmol/L (ref 3.5–5.2)
Sodium: 136 mmol/L (ref 134–144)
Total Protein: 6.9 g/dL (ref 6.0–8.5)
eGFR: 110 mL/min/{1.73_m2} (ref >59)

## 2023-05-05 LAB — PSA: Prostate Specific Ag, Serum: 0.5 ng/mL (ref 0.0–4.0)

## 2023-05-05 LAB — TSH: TSH: 0.783 u[IU]/mL (ref 0.450–4.500)

## 2023-05-08 ENCOUNTER — Encounter: Payer: Self-pay | Admitting: Family Medicine

## 2023-07-13 ENCOUNTER — Ambulatory Visit: Payer: Self-pay | Admitting: *Deleted

## 2023-07-13 NOTE — Telephone Encounter (Signed)
   Chief Complaint: Patient reports he was to call and report continued symptoms to provider- SOB with exertion- feels like he is out of shape, intermittent hand shaking- 1-2 times every 2 weeks Symptoms: Patient states he has had these symptoms since he started BP medication- and he is not sure if it is the medication causing him to feel this way. Patient states he feels SOB, exhausted like he is not physically fit when he is active- not normal for him. Patient also reports his hands will shake for 1-2 minutes at different times- no reason Frequency: intermittent- but continued symptoms Pertinent Negatives: Patient denies dizziness, runny nose, cough, chest pain, fever  Disposition: [] ED /[] Urgent Care (no appt availability in office) / [] Appointment(In office/virtual)/ []  [place] [facility]/ [] Home Care/ [] Refused Recommended Disposition /[] [place] [facility]/ [x]  Follow-up with PCP Additional Notes: Patient would prefer follow up appointment with PCP- but nothing is available within disposition.    Copied from CRM [id number]. Topic: Clinical - Red Word Triage >> Jul 13, 2023 11:46 AM Crispin Dolphin wrote: Red Word that prompted transfer to Nurse Triage: shortness of breath, shaking - provider told him to call if having these symptoms. Blood pressure medication Reason for Disposition  [1] MILD longstanding difficulty breathing AND [2]  SAME as normal  Answer Assessment - Initial Assessment Questions 1. RESPIRATORY STATUS: "Describe your breathing?" (e.g., wheezing, shortness of breath, unable to speak, severe coughing)      SOB- with exertion, hand treble/shake- passes after 1-2 minutes 2. ONSET: "When did this breathing problem begin?"      After BP medication- readings are steady- not fluctuating  3. PATTERN "Does the difficult breathing come and go, or has it been constant since it started?"      Comes and goes- SOB- with exertion, shakes 1-2 x/couple weeks 4. SEVERITY: "How bad is your  breathing?" (e.g., mild, moderate, severe)    - MILD: No SOB at rest, mild SOB with walking, speaks normally in sentences, can lie down, no retractions, pulse < 100.    - MODERATE: SOB at rest, SOB with minimal exertion and prefers to sit, cannot lie down flat, speaks in phrases, mild retractions, audible wheezing, pulse 100-120.    - SEVERE: Very SOB at rest, speaks in single words, struggling to breathe, sitting hunched forward, retractions, pulse > 120      episodic 5. RECURRENT SYMPTOM: "Have you had difficulty breathing before?" If Yes, ask: "When was the last time?" and "What happened that time?"      Did not occur until the new BP medication  6. CARDIAC HISTORY: "Do you have any history of heart disease?" (e.g., heart attack, angina, bypass surgery, angioplasty)      hypertension  8. CAUSE: "What do you think is causing the breathing problem?"      Not sure 9. OTHER SYMPTOMS: "Do you have any other symptoms? (e.g., dizziness, runny nose, cough, chest pain, fever)     no  Protocols used: Breathing Difficulty-A-AH

## 2023-07-14 NOTE — Telephone Encounter (Signed)
 Noted and forwarding to PCP. Will likely need next available appointment with PCP. Will go ahead and schedule and then later adjust if PCP decides he needs to be seen sooner. Scheduled for 08/22/2023 as next available.

## 2023-07-16 NOTE — Telephone Encounter (Signed)
 I have no availability this week. OK to put in same day next week if no other providers has any availability

## 2023-07-17 NOTE — Telephone Encounter (Signed)
 Called patient to see if he would like to schedule with another provider. Left message for patient to call back.

## 2023-08-22 ENCOUNTER — Encounter: Payer: Self-pay | Admitting: Family Medicine

## 2023-08-22 ENCOUNTER — Ambulatory Visit: Admitting: Family Medicine

## 2023-08-22 VITALS — BP 150/115 | HR 82 | Ht 69.6 in | Wt 221.0 lb

## 2023-08-22 DIAGNOSIS — I10 Essential (primary) hypertension: Secondary | ICD-10-CM

## 2023-08-22 MED ORDER — OLMESARTAN MEDOXOMIL 20 MG PO TABS: 10.0000 mg | ORAL_TABLET | Freq: Every day | ORAL | 3 refills | Status: DC

## 2023-08-22 NOTE — Assessment & Plan Note (Signed)
 Strange reaction to lisinopril . Entirely resolved off it. Will start olmesartan and recheck in 2 weeks. Call with any concerns.

## 2023-08-22 NOTE — Progress Notes (Signed)
 BP (!) 150/115 (BP Location: Left Arm, Patient Position: Sitting, Cuff Size: Large) Comment: Stopping taking bp meds about a week ago  Pulse 82   Ht 5' 9.6 (1.768 m)   Wt 221 lb (100.2 kg)   SpO2 96%   BMI 32.08 kg/m    Subjective:    Patient ID: Gary Gray, male    DOB: March 16, 1975, 48 y.o.   MRN: [id number]  HPI: Gary Gray is a 48 y.o. male  Chief Complaint  Patient presents with   Medication Problem   HYPERTENSION- stopped his lisinopril  about 2-2.5 weeks  Hypertension status: uncontrolled  Satisfied with current treatment? no Duration of hypertension: chronic BP monitoring frequency:  not checking  BP medication side effects:  yes- was feeling SOB with exertion, having tremors- all 100% gone since stopping the medicine Medication compliance: has been off x 2+ weeks Previous BP meds: lisinopril  Aspirin : no Recurrent headaches: no Visual changes: no Palpitations: no Dyspnea: no Chest pain: no Lower extremity edema: no Dizzy/lightheaded: no  Relevant past medical, surgical, family and social history reviewed and updated as indicated. Interim medical history since our last visit reviewed. Allergies and medications reviewed and updated.  Review of Systems  Constitutional: Negative.   Respiratory: Negative.    Cardiovascular: Negative.   Musculoskeletal: Negative.   Neurological: Negative.   Psychiatric/Behavioral: Negative.      Per HPI unless specifically indicated above     Objective:    BP (!) 150/115 (BP Location: Left Arm, Patient Position: Sitting, Cuff Size: Large) Comment: Stopping taking bp meds about a week ago  Pulse 82   Ht 5' 9.6 (1.768 m)   Wt 221 lb (100.2 kg)   SpO2 96%   BMI 32.08 kg/m   Wt Readings from Last 3 Encounters:  08/22/23 221 lb (100.2 kg)  05/04/23 202 lb (91.6 kg)  03/20/23 203 lb (92.1 kg)    Physical Exam Vitals and nursing note reviewed.  Constitutional:      General: He is not in acute distress.     Appearance: Normal appearance. He is not ill-appearing, toxic-appearing or diaphoretic.  HENT:     Head: Normocephalic and atraumatic.     Right Ear: External ear normal.     Left Ear: External ear normal.     Nose: Nose normal.     Mouth/Throat:     Mouth: Mucous membranes are moist.     Pharynx: Oropharynx is clear.   Eyes:     General: No scleral icterus.       Right eye: No discharge.        Left eye: No discharge.     Extraocular Movements: Extraocular movements intact.     Conjunctiva/sclera: Conjunctivae normal.     Pupils: Pupils are equal, round, and reactive to light.    Cardiovascular:     Rate and Rhythm: Normal rate and regular rhythm.     Pulses: Normal pulses.     Heart sounds: Normal heart sounds. No murmur heard.    No friction rub. No gallop.  Pulmonary:     Effort: Pulmonary effort is normal. No respiratory distress.     Breath sounds: Normal breath sounds. No stridor. No wheezing, rhonchi or rales.  Chest:     Chest wall: No tenderness.   Musculoskeletal:        General: Normal range of motion.     Cervical back: Normal range of motion and neck supple.   Skin:    General:  Skin is warm and dry.     Capillary Refill: Capillary refill takes less than 2 seconds.     Coloration: Skin is not jaundiced or pale.     Findings: No bruising, erythema, lesion or rash.   Neurological:     General: No focal deficit present.     Mental Status: He is alert and oriented to person, place, and time. Mental status is at baseline.   Psychiatric:        Mood and Affect: Mood normal.        Behavior: Behavior normal.        Thought Content: Thought content normal.        Judgment: Judgment normal.     Results for orders placed or performed in visit on 05/04/23  Microalbumin, Urine Waived   Collection Time: 05/04/23  1:58 PM  Result Value Ref Range   Microalb, Ur Waived 10 0 - 19 mg/L   Creatinine, Urine Waived 50 10 - 300 mg/dL   Microalb/Creat Ratio <30 <30  mg/g  Comprehensive metabolic panel   Collection Time: 05/04/23  1:59 PM  Result Value Ref Range   Glucose 96 70 - 99 mg/dL   BUN 10 6 - 24 mg/dL   Creatinine, Ser 1.61 0.76 - 1.27 mg/dL   eGFR 096 >04 VW/UJW/1.19   BUN/Creatinine Ratio 13 9 - 20   Sodium 136 134 - 144 mmol/L   Potassium 4.2 3.5 - 5.2 mmol/L   Chloride 98 96 - 106 mmol/L   CO2 21 20 - 29 mmol/L   Calcium 9.7 8.7 - 10.2 mg/dL   Total Protein 6.9 6.0 - 8.5 g/dL   Albumin 4.6 4.1 - 5.1 g/dL   Globulin, Total 2.3 1.5 - 4.5 g/dL   Bilirubin Total 0.3 0.0 - 1.2 mg/dL   Alkaline Phosphatase 71 44 - 121 IU/L   AST 16 0 - 40 IU/L   ALT 15 0 - 44 IU/L  CBC with Differential/Platelet   Collection Time: 05/04/23  1:59 PM  Result Value Ref Range   WBC 7.6 3.4 - 10.8 x10E3/uL   RBC 4.87 4.14 - 5.80 x10E6/uL   Hemoglobin 14.3 13.0 - 17.7 g/dL   Hematocrit 14.7 82.9 - 51.0 %   MCV 90 79 - 97 fL   MCH 29.4 26.6 - 33.0 pg   MCHC 32.6 31.5 - 35.7 g/dL   RDW 56.2 13.0 - 86.5 %   Platelets 260 150 - 450 x10E3/uL   Neutrophils 66 Not Estab. %   Lymphs 26 Not Estab. %   Monocytes 5 Not Estab. %   Eos 2 Not Estab. %   Basos 1 Not Estab. %   Neutrophils Absolute 5.0 1.4 - 7.0 x10E3/uL   Lymphocytes Absolute 2.0 0.7 - 3.1 x10E3/uL   Monocytes Absolute 0.4 0.1 - 0.9 x10E3/uL   EOS (ABSOLUTE) 0.2 0.0 - 0.4 x10E3/uL   Basophils Absolute 0.1 0.0 - 0.2 x10E3/uL   Immature Granulocytes 0 Not Estab. %   Immature Grans (Abs) 0.0 0.0 - 0.1 x10E3/uL  Lipid Panel w/o Chol/HDL Ratio   Collection Time: 05/04/23  1:59 PM  Result Value Ref Range   Cholesterol, Total 185 100 - 199 mg/dL   Triglycerides 784 0 - 149 mg/dL   HDL 29 (L) >69 mg/dL   VLDL Cholesterol Cal 24 5 - 40 mg/dL   LDL Chol Calc (NIH) 629 (H) 0 - 99 mg/dL  PSA   Collection Time: 05/04/23  1:59 PM  Result Value  Ref Range   Prostate Specific Ag, Serum 0.5 0.0 - 4.0 ng/mL  TSH   Collection Time: 05/04/23  1:59 PM  Result Value Ref Range   TSH 0.783 0.450 - 4.500  uIU/mL      Assessment & Plan:   Problem List Items Addressed This Visit       Cardiovascular and Mediastinum   HTN (hypertension) - Primary   Strange reaction to lisinopril . Entirely resolved off it. Will start olmesartan and recheck in 2 weeks. Call with any concerns.       Relevant Medications   olmesartan (BENICAR) 20 MG tablet     Follow up plan: Return in about 2 weeks (around 09/05/2023) for OK to use same day.

## 2023-09-13 ENCOUNTER — Telehealth: Payer: Self-pay | Admitting: Family Medicine

## 2023-09-13 NOTE — Telephone Encounter (Signed)
 Called to confirm appt and pt stated that the wrong BP medication was filled he stated that Dr. Vicci was to send in a different RX from Lisinopril . Please advise

## 2023-09-18 ENCOUNTER — Ambulatory Visit: Admitting: Family Medicine

## 2023-11-02 ENCOUNTER — Ambulatory Visit: Payer: Federal, State, Local not specified - PPO | Admitting: Family Medicine

## 2023-11-02 ENCOUNTER — Encounter: Payer: Self-pay | Admitting: Family Medicine

## 2023-11-02 VITALS — BP 127/85 | HR 93 | Temp 98.4°F | Ht 69.5 in | Wt 220.2 lb

## 2023-11-02 DIAGNOSIS — I10 Essential (primary) hypertension: Secondary | ICD-10-CM | POA: Diagnosis not present

## 2023-11-02 DIAGNOSIS — Z789 Other specified health status: Secondary | ICD-10-CM | POA: Diagnosis not present

## 2023-11-02 DIAGNOSIS — F320 Major depressive disorder, single episode, mild: Secondary | ICD-10-CM

## 2023-11-02 MED ORDER — FLUOXETINE HCL 20 MG PO CAPS: 20.0000 mg | ORAL_CAPSULE | Freq: Every day | ORAL | 1 refills | Status: AC

## 2023-11-02 MED ORDER — OLMESARTAN MEDOXOMIL 20 MG PO TABS: 10.0000 mg | ORAL_TABLET | Freq: Every day | ORAL | 1 refills | Status: AC

## 2023-11-02 MED ORDER — NAPROXEN 500 MG PO TABS: 500.0000 mg | ORAL_TABLET | Freq: Two times a day (BID) | ORAL | 1 refills | Status: AC

## 2023-11-02 NOTE — Progress Notes (Signed)
 BP 127/85   Pulse 93   Temp 98.4 F (36.9 C) (Oral)   Ht 5' 9.5 (1.765 m)   Wt 220 lb 3.2 oz (99.9 kg)   SpO2 97%   BMI 32.05 kg/m    Subjective:    Patient ID: Gary Gray, male    DOB: 10-May-1975, 48 y.o.   MRN: [id number]  HPI: Gary Gray is a 48 y.o. male  Chief Complaint  Patient presents with   Hypertension   HYPERTENSION  Hypertension status: controlled  Satisfied with current treatment? yes Duration of hypertension: chronic BP monitoring frequency:  rarely BP medication side effects:  no Medication compliance: excellent compliance Previous BP meds:olmesartan  Aspirin : no Recurrent headaches: no Visual changes: no Palpitations: no Dyspnea: no Chest pain: no Lower extremity edema: no Dizzy/lightheaded: no  DEPRESSION Mood status: controlled Satisfied with current treatment?: yes Symptom severity: mild  Duration of current treatment : chronic Side effects: no Medication compliance: excellent compliance Psychotherapy/counseling: no  Previous psychiatric medications: prozac  Depressed mood: no Anxious mood: no Anhedonia: no Significant weight loss or gain: no Insomnia: no  Fatigue: no Feelings of worthlessness or guilt: no Impaired concentration/indecisiveness: no Suicidal ideations: no Hopelessness: no Crying spells: no    11/02/2023    2:06 PM 08/22/2023    1:26 PM 05/04/2023    1:49 PM 01/17/2023    2:05 PM 10/17/2022    2:28 PM  Depression screen PHQ 2/9  Decreased Interest 0 0 0 0 1  Down, Depressed, Hopeless 0 0 0 0 1  PHQ - 2 Score 0 0 0 0 2  Altered sleeping 0 0 0 0 0  Tired, decreased energy 0 0 0 0 1  Change in appetite 0 0 0 0 0  Feeling bad or failure about yourself  0 0 0 0 1  Trouble concentrating 0 0 0 0 0  Moving slowly or fidgety/restless 0 0 0 0 0  Suicidal thoughts 0 0 0 0 0  PHQ-9 Score 0 0 0 0 4  Difficult doing work/chores    Not difficult at all Not difficult at all    Relevant past medical, surgical, family  and social history reviewed and updated as indicated. Interim medical history since our last visit reviewed. Allergies and medications reviewed and updated.  Review of Systems  Constitutional: Negative.   Respiratory: Negative.    Cardiovascular: Negative.   Musculoskeletal: Negative.   Neurological: Negative.   Psychiatric/Behavioral: Negative.      Per HPI unless specifically indicated above     Objective:    BP 127/85   Pulse 93   Temp 98.4 F (36.9 C) (Oral)   Ht 5' 9.5 (1.765 m)   Wt 220 lb 3.2 oz (99.9 kg)   SpO2 97%   BMI 32.05 kg/m   Wt Readings from Last 3 Encounters:  11/02/23 220 lb 3.2 oz (99.9 kg)  08/22/23 221 lb (100.2 kg)  05/04/23 202 lb (91.6 kg)    Physical Exam Vitals and nursing note reviewed.  Constitutional:      General: He is not in acute distress.    Appearance: Normal appearance. He is not ill-appearing, toxic-appearing or diaphoretic.  HENT:     Head: Normocephalic and atraumatic.     Right Ear: External ear normal.     Left Ear: External ear normal.     Nose: Nose normal.     Mouth/Throat:     Mouth: Mucous membranes are moist.     Pharynx: Oropharynx  is clear.  Eyes:     General: No scleral icterus.       Right eye: No discharge.        Left eye: No discharge.     Extraocular Movements: Extraocular movements intact.     Conjunctiva/sclera: Conjunctivae normal.     Pupils: Pupils are equal, round, and reactive to light.  Cardiovascular:     Rate and Rhythm: Normal rate and regular rhythm.     Pulses: Normal pulses.     Heart sounds: Normal heart sounds. No murmur heard.    No friction rub. No gallop.  Pulmonary:     Effort: Pulmonary effort is normal. No respiratory distress.     Breath sounds: Normal breath sounds. No stridor. No wheezing, rhonchi or rales.  Chest:     Chest wall: No tenderness.  Musculoskeletal:        General: Normal range of motion.     Cervical back: Normal range of motion and neck supple.  Skin:     General: Skin is warm and dry.     Capillary Refill: Capillary refill takes less than 2 seconds.     Coloration: Skin is not jaundiced or pale.     Findings: No bruising, erythema, lesion or rash.  Neurological:     General: No focal deficit present.     Mental Status: He is alert and oriented to person, place, and time. Mental status is at baseline.  Psychiatric:        Mood and Affect: Mood normal.        Behavior: Behavior normal.        Thought Content: Thought content normal.        Judgment: Judgment normal.     Results for orders placed or performed in visit on 05/04/23  Microalbumin, Urine Waived   Collection Time: 05/04/23  1:58 PM  Result Value Ref Range   Microalb, Ur Waived 10 0 - 19 mg/L   Creatinine, Urine Waived 50 10 - 300 mg/dL   Microalb/Creat Ratio <30 <30 mg/g  Comprehensive metabolic panel   Collection Time: 05/04/23  1:59 PM  Result Value Ref Range   Glucose 96 70 - 99 mg/dL   BUN 10 6 - 24 mg/dL   Creatinine, Ser 9.19 0.76 - 1.27 mg/dL   eGFR 889 >40 fO/fpw/8.26   BUN/Creatinine Ratio 13 9 - 20   Sodium 136 134 - 144 mmol/L   Potassium 4.2 3.5 - 5.2 mmol/L   Chloride 98 96 - 106 mmol/L   CO2 21 20 - 29 mmol/L   Calcium 9.7 8.7 - 10.2 mg/dL   Total Protein 6.9 6.0 - 8.5 g/dL   Albumin 4.6 4.1 - 5.1 g/dL   Globulin, Total 2.3 1.5 - 4.5 g/dL   Bilirubin Total 0.3 0.0 - 1.2 mg/dL   Alkaline Phosphatase 71 44 - 121 IU/L   AST 16 0 - 40 IU/L   ALT 15 0 - 44 IU/L  CBC with Differential/Platelet   Collection Time: 05/04/23  1:59 PM  Result Value Ref Range   WBC 7.6 3.4 - 10.8 x10E3/uL   RBC 4.87 4.14 - 5.80 x10E6/uL   Hemoglobin 14.3 13.0 - 17.7 g/dL   Hematocrit 56.0 62.4 - 51.0 %   MCV 90 79 - 97 fL   MCH 29.4 26.6 - 33.0 pg   MCHC 32.6 31.5 - 35.7 g/dL   RDW 87.4 88.3 - 84.5 %   Platelets 260 150 - 450 x10E3/uL  Neutrophils 66 Not Estab. %   Lymphs 26 Not Estab. %   Monocytes 5 Not Estab. %   Eos 2 Not Estab. %   Basos 1 Not Estab. %    Neutrophils Absolute 5.0 1.4 - 7.0 x10E3/uL   Lymphocytes Absolute 2.0 0.7 - 3.1 x10E3/uL   Monocytes Absolute 0.4 0.1 - 0.9 x10E3/uL   EOS (ABSOLUTE) 0.2 0.0 - 0.4 x10E3/uL   Basophils Absolute 0.1 0.0 - 0.2 x10E3/uL   Immature Granulocytes 0 Not Estab. %   Immature Grans (Abs) 0.0 0.0 - 0.1 x10E3/uL  Lipid Panel w/o Chol/HDL Ratio   Collection Time: 05/04/23  1:59 PM  Result Value Ref Range   Cholesterol, Total 185 100 - 199 mg/dL   Triglycerides 868 0 - 149 mg/dL   HDL 29 (L) >60 mg/dL   VLDL Cholesterol Cal 24 5 - 40 mg/dL   LDL Chol Calc (NIH) 867 (H) 0 - 99 mg/dL  PSA   Collection Time: 05/04/23  1:59 PM  Result Value Ref Range   Prostate Specific Ag, Serum 0.5 0.0 - 4.0 ng/mL  TSH   Collection Time: 05/04/23  1:59 PM  Result Value Ref Range   TSH 0.783 0.450 - 4.500 uIU/mL      Assessment & Plan:   Problem List Items Addressed This Visit       Cardiovascular and Mediastinum   HTN (hypertension) - Primary   Under good control on current regimen. Continue current regimen. Continue to monitor. Call with any concerns. Refills given. Labs drawn today.        Relevant Medications   olmesartan  (BENICAR ) 20 MG tablet   Other Relevant Orders   Basic metabolic panel with GFR     Other   Depression, major, single episode, mild (HCC)   Under good control on current regimen. Continue current regimen. Continue to monitor. Call with any concerns. Refills given.       Relevant Medications   FLUoxetine  (PROZAC ) 20 MG capsule   Other Visit Diagnoses       Hepatitis B vaccination status unknown       Labs drawn today. Await results.   Relevant Orders   Hepatitis B surface antibody,quantitative        Follow up plan: Return in about 6 months (around 05/04/2024) for physical.

## 2023-11-02 NOTE — Assessment & Plan Note (Signed)
 Under good control on current regimen. Continue current regimen. Continue to monitor. Call with any concerns. Refills given.

## 2023-11-02 NOTE — Assessment & Plan Note (Signed)
 Under good control on current regimen. Continue current regimen. Continue to monitor. Call with any concerns. Refills given. Labs drawn today.

## 2023-11-03 ENCOUNTER — Ambulatory Visit: Payer: Self-pay | Admitting: Family Medicine

## 2023-11-03 LAB — HEPATITIS B SURFACE ANTIBODY, QUANTITATIVE: Hepatitis B Surf Ab Quant: <3.5 m[IU]/mL — ABNORMAL LOW

## 2023-11-03 LAB — BASIC METABOLIC PANEL WITH GFR
BUN/Creatinine Ratio: 12 (ref 9–20)
BUN: 12 mg/dL (ref 6–24)
CO2: 19 mmol/L — ABNORMAL LOW (ref 20–29)
Calcium: 9.7 mg/dL (ref 8.7–10.2)
Chloride: 100 mmol/L (ref 96–106)
Creatinine, Ser: 1.01 mg/dL (ref 0.76–1.27)
Glucose: 131 mg/dL — ABNORMAL HIGH (ref 70–99)
Potassium: 3.9 mmol/L (ref 3.5–5.2)
Sodium: 137 mmol/L (ref 134–144)
eGFR: 92 mL/min/1.73 (ref >59)

## 2023-11-26 ENCOUNTER — Other Ambulatory Visit: Payer: Self-pay | Admitting: Family Medicine

## 2023-11-27 NOTE — Telephone Encounter (Signed)
 Refused Lisinopril  20 mg because it was discontinued 08/22/2023.

## 2024-03-18 ENCOUNTER — Other Ambulatory Visit: Payer: Self-pay | Admitting: Family Medicine

## 2024-03-18 NOTE — Telephone Encounter (Unsigned)
 Copied from CRM [id number]. Topic: Clinical - Medication Refill >> Mar 18, 2024  3:21 PM Larissa S wrote: Medication: FLUoxetine  (PROZAC ) 20 MG capsule  Has the patient contacted their pharmacy? Yes (Agent: If no, request that the patient contact the pharmacy for the refill. If patient does not wish to contact the pharmacy document the reason why and proceed with request.) (Agent: If yes, when and what did the pharmacy advise?)  This is the patient's preferred pharmacy:  [facility] [address] ([address]), [place] - [address] [place]) [place] [address] Phone: [phone] Fax: [phone]  Is this the correct pharmacy for this prescription? Yes If no, delete pharmacy and type the correct one.   Has the prescription been filled recently? No  Is the patient out of the medication? Yes  Has the patient been seen for an appointment in the last year OR does the patient have an upcoming appointment? Yes  Can we respond through MyChart? No, requesting a call once completed  Agent: Please be advised that Rx refills may take up to 3 business days. We ask that you follow-up with your pharmacy.

## 2024-03-19 NOTE — Telephone Encounter (Signed)
 Contacted [facility] because according to last prescription, medication should not be out. Pharmacy states that they do have a refill on file from us  and are getting it ready for the patient now.   Called and LVM notifying patient of the above information.

## 2024-03-19 NOTE — Telephone Encounter (Signed)
 Copied from CRM [id number]. Topic: Clinical - Medication Refill >> Mar 18, 2024  3:21 PM Larissa S wrote: Medication: FLUoxetine  (PROZAC ) 20 MG capsule  Has the patient contacted their pharmacy? Yes (Agent: If no, request that the patient contact the pharmacy for the refill. If patient does not wish to contact the pharmacy document the reason why and proceed with request.) (Agent: If yes, when and what did the pharmacy advise?)  This is the patient's preferred pharmacy:  [facility] [address] ([address]), [place] - [address] [place]) [place] [address] Phone: [phone] Fax: [phone]  Is this the correct pharmacy for this prescription? Yes If no, delete pharmacy and type the correct one.   Has the prescription been filled recently? No  Is the patient out of the medication? Yes  Has the patient been seen for an appointment in the last year OR does the patient have an upcoming appointment? Yes  Can we respond through MyChart? No, requesting a call once completed  Agent: Please be advised that Rx refills may take up to 3 business days. We ask that you follow-up with your pharmacy. >> Mar 19, 2024 12:41 PM Zebedee SAUNDERS wrote: Pt calling on status of refill, did mention to pt that it could take up to 3 business days.

## 2024-05-09 ENCOUNTER — Encounter: Admitting: Family Medicine
